# Patient Record
Sex: Male | Born: 1945 | Race: White | Hispanic: No | Marital: Married | Smoking: Never smoker
Health system: Southern US, Community
[De-identification: ages and names within clinical notes are randomized; demographics above are authoritative.]

## PROBLEM LIST (undated history)

## (undated) DIAGNOSIS — E119 Type 2 diabetes mellitus without complications: Secondary | ICD-10-CM

## (undated) DIAGNOSIS — K5790 Diverticulosis of intestine, part unspecified, without perforation or abscess without bleeding: Secondary | ICD-10-CM

## (undated) DIAGNOSIS — K219 Gastro-esophageal reflux disease without esophagitis: Secondary | ICD-10-CM

## (undated) DIAGNOSIS — I10 Essential (primary) hypertension: Secondary | ICD-10-CM

## (undated) DIAGNOSIS — E079 Disorder of thyroid, unspecified: Secondary | ICD-10-CM

## (undated) DIAGNOSIS — I2510 Atherosclerotic heart disease of native coronary artery without angina pectoris: Secondary | ICD-10-CM

## (undated) DIAGNOSIS — E039 Hypothyroidism, unspecified: Secondary | ICD-10-CM

## (undated) DIAGNOSIS — R7303 Prediabetes: Secondary | ICD-10-CM

## (undated) DIAGNOSIS — K589 Irritable bowel syndrome without diarrhea: Secondary | ICD-10-CM

## (undated) HISTORY — PX: CIRCUMCISION: SUR203

## (undated) HISTORY — PX: OTHER SURGICAL HISTORY: SHX169

## (undated) HISTORY — DX: Type 2 diabetes mellitus without complications: E11.9

## (undated) HISTORY — PX: COLONOSCOPY: SHX174

## (undated) HISTORY — PX: TONSILLECTOMY: SUR1361

## (undated) HISTORY — DX: Atherosclerotic heart disease of native coronary artery without angina pectoris: I25.10

---
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
## 2016-06-25 HISTORY — PX: COLONOSCOPY: SHX174

## 2017-05-28 HISTORY — PX: MENISCUS REPAIR: SHX5179

## 2019-08-17 ENCOUNTER — Other Ambulatory Visit: Payer: Self-pay

## 2019-08-17 ENCOUNTER — Other Ambulatory Visit (HOSPITAL_COMMUNITY): Payer: Self-pay | Admitting: Physician Assistant

## 2019-08-17 ENCOUNTER — Ambulatory Visit (HOSPITAL_COMMUNITY)
Admission: RE | Admit: 2019-08-17 | Discharge: 2019-08-17 | Disposition: A | Discharge status: Home / Self Care | Payer: Medicare Other | Source: Ambulatory Visit | Attending: Physician Assistant | Admitting: Physician Assistant

## 2019-08-17 DIAGNOSIS — M25562 Pain in left knee: Secondary | ICD-10-CM

## 2019-09-15 DIAGNOSIS — M25562 Pain in left knee: Secondary | ICD-10-CM | POA: Insufficient documentation

## 2020-01-22 ENCOUNTER — Encounter (HOSPITAL_COMMUNITY): Payer: Self-pay

## 2020-01-22 ENCOUNTER — Emergency Department (HOSPITAL_COMMUNITY)
Admission: EM | Admit: 2020-01-22 | Discharge: 2020-01-22 | Disposition: A | Discharge status: Home / Self Care | Payer: Medicare Other | Attending: Emergency Medicine | Admitting: Emergency Medicine

## 2020-01-22 ENCOUNTER — Other Ambulatory Visit: Payer: Self-pay

## 2020-01-22 ENCOUNTER — Emergency Department (HOSPITAL_COMMUNITY): Payer: Medicare Other

## 2020-01-22 DIAGNOSIS — M545 Low back pain, unspecified: Secondary | ICD-10-CM | POA: Diagnosis not present

## 2020-01-22 DIAGNOSIS — R109 Unspecified abdominal pain: Secondary | ICD-10-CM | POA: Insufficient documentation

## 2020-01-22 DIAGNOSIS — E119 Type 2 diabetes mellitus without complications: Secondary | ICD-10-CM | POA: Insufficient documentation

## 2020-01-22 HISTORY — DX: Gastro-esophageal reflux disease without esophagitis: K21.9

## 2020-01-22 HISTORY — DX: Disorder of thyroid, unspecified: E07.9

## 2020-01-22 HISTORY — DX: Irritable bowel syndrome, unspecified: K58.9

## 2020-01-22 HISTORY — DX: Type 2 diabetes mellitus without complications: E11.9

## 2020-01-22 LAB — BASIC METABOLIC PANEL
Anion gap: 9 (ref 5–15)
BUN: 18 mg/dL (ref 8–23)
CO2: 23 mmol/L (ref 22–32)
Calcium: 9.3 mg/dL (ref 8.9–10.3)
Chloride: 104 mmol/L (ref 98–111)
Creatinine, Ser: 1.07 mg/dL (ref 0.61–1.24)
GFR, Estimated: >60 mL/min (ref >60)
Glucose, Bld: 178 mg/dL — ABNORMAL HIGH (ref 70–99)
Potassium: 4.1 mmol/L (ref 3.5–5.1)
Sodium: 136 mmol/L (ref 135–145)

## 2020-01-22 LAB — CBC WITH DIFFERENTIAL/PLATELET
Abs Immature Granulocytes: 0.07 10*3/uL (ref 0.00–0.07)
Basophils Absolute: 0.1 10*3/uL (ref 0.0–0.1)
Basophils Relative: 1 %
Eosinophils Absolute: 0.1 10*3/uL (ref 0.0–0.5)
Eosinophils Relative: 2 %
HCT: 40.7 % (ref 39.0–52.0)
Hemoglobin: 14.1 g/dL (ref 13.0–17.0)
Immature Granulocytes: 1 %
Lymphocytes Relative: 22 %
Lymphs Abs: 1.3 10*3/uL (ref 0.7–4.0)
MCH: 30.9 pg (ref 26.0–34.0)
MCHC: 34.6 g/dL (ref 30.0–36.0)
MCV: 89.3 fL (ref 80.0–100.0)
Monocytes Absolute: 0.4 10*3/uL (ref 0.1–1.0)
Monocytes Relative: 7 %
Neutro Abs: 4.1 10*3/uL (ref 1.7–7.7)
Neutrophils Relative %: 67 %
Platelets: 213 10*3/uL (ref 150–400)
RBC: 4.56 MIL/uL (ref 4.22–5.81)
RDW: 13.1 % (ref 11.5–15.5)
WBC: 6.1 10*3/uL (ref 4.0–10.5)
nRBC: 0 % (ref 0.0–0.2)

## 2020-01-22 LAB — URINALYSIS, ROUTINE W REFLEX MICROSCOPIC
Bilirubin Urine: NEGATIVE
Glucose, UA: 150 mg/dL — AB
Hgb urine dipstick: NEGATIVE
Ketones, ur: NEGATIVE mg/dL
Leukocytes,Ua: NEGATIVE
Nitrite: NEGATIVE
Protein, ur: NEGATIVE mg/dL
Specific Gravity, Urine: 1.012 (ref 1.005–1.030)
pH: 6 (ref 5.0–8.0)

## 2020-01-22 MED ORDER — METHOCARBAMOL 500 MG PO TABS: 500.0000 mg | ORAL_TABLET | Freq: Three times a day (TID) | ORAL | 0 refills | Status: DC

## 2020-01-22 MED ORDER — MORPHINE SULFATE (PF) 4 MG/ML IV SOLN
4.0000 mg | Freq: Once | INTRAVENOUS | Status: AC
  Administered 2020-01-22: 4 mg via INTRAVENOUS
  Filled 2020-01-22: qty 1

## 2020-01-22 MED ORDER — KETOROLAC TROMETHAMINE 30 MG/ML IJ SOLN
30.0000 mg | Freq: Once | INTRAMUSCULAR | Status: AC
  Administered 2020-01-22: 30 mg via INTRAVENOUS
  Filled 2020-01-22: qty 1

## 2020-01-22 MED ORDER — ONDANSETRON HCL 4 MG/2ML IJ SOLN
4.0000 mg | Freq: Once | INTRAMUSCULAR | Status: AC
  Administered 2020-01-22: 4 mg via INTRAVENOUS
  Filled 2020-01-22: qty 2

## 2020-01-22 MED ORDER — NAPROXEN 500 MG PO TABS: 500.0000 mg | ORAL_TABLET | Freq: Two times a day (BID) | ORAL | 0 refills | Status: DC

## 2020-01-22 NOTE — ED Notes (Signed)
Pt to CT

## 2020-01-22 NOTE — Discharge Instructions (Addendum)
Your blood work today was reassuring.  The CT scan did not show evidence of a kidney stone.  Your symptoms are likely musculoskeletal.  Try to avoid twisting, bending or heavy lifting for at least 1 week.  You may also alternate ice and heat to your left lower back.  Follow-up with your primary doctor for recheck or return emergency department if you develop any worsening symptoms.

## 2020-01-22 NOTE — ED Notes (Signed)
Patient transported to CT 

## 2020-01-22 NOTE — ED Triage Notes (Signed)
Pt presents to ED with complaints of left flank pain started 1 weeks ago but worse today. Pt denies urinary symptoms.

## 2020-01-22 NOTE — ED Provider Notes (Signed)
[facility] EMERGENCY DEPARTMENT Provider Note   CSN: [id number] Arrival date & time: 01/22/20  1631     History Chief Complaint  Patient presents with  . Flank Pain    Bradley Acevedo Bradley Acevedo is a 74 y.o. male.  HPI      Bradley Acevedo is a 74 y.o. male with history of DM, thyroid disease and GERD who presents to the Emergency Department complaining of left sided flank pain for one week.  Gradually worsening today.  He describes a constant, pain to his left lower back that is non radiating but worse with movement.  He has been taking OTC pain medications without relief.  He states that he was dumping leaves from his mower bagger on Saturday.  He denies fall, abdominal pain, dysuria, urine or bowel changes, fever, pain or weakness of the lower extremities.  No history of kidney stones.     Past Medical History:  Diagnosis Date  . Acid reflux   . Diabetes mellitus without complication (HCC)   . IBS (irritable bowel syndrome)   . Thyroid disease     There are no problems to display for this patient.   Past Surgical History:  Procedure Laterality Date  . CIRCUMCISION    . COLONOSCOPY    . meniscus left knee    . TONSILLECTOMY         No family history on file.  Social History   Tobacco Use  . Smoking status: Never Smoker  . Smokeless tobacco: Never Used  Substance Use Topics  . Alcohol use: Not Currently  . Drug use: Not Currently    Home Medications Prior to Admission medications   Medication Sig Start Date End Date Taking? Authorizing Provider  methocarbamol (ROBAXIN) 500 MG tablet Take 1 tablet (500 mg total) by mouth 3 (three) times daily. 01/22/20   Serin Thornell, PA-C  naproxen (NAPROSYN) 500 MG tablet Take 1 tablet (500 mg total) by mouth 2 (two) times daily with a meal. 01/22/20   Tiahna Cure, PA-C    Allergies    Patient has no known allergies.  Review of Systems   Review of Systems  Constitutional: Negative for fever.  Respiratory: Negative  for shortness of breath.   Cardiovascular: Negative for chest pain.  Gastrointestinal: Negative for abdominal pain, constipation, diarrhea, nausea and vomiting.  Genitourinary: Positive for flank pain. Negative for decreased urine volume, difficulty urinating, discharge, dysuria, frequency, hematuria, penile swelling, scrotal swelling and testicular pain.  Musculoskeletal: Positive for back pain. Negative for joint swelling.  Skin: Negative for rash.  Neurological: Negative for weakness and numbness.    Physical Exam Updated Vital Signs BP (!) 152/89 (BP Location: Left Arm)   Pulse 76   Temp 97.9 F (36.6 C) (Oral)   Resp 15   Ht 5\' 10"  (1.778 m)   Wt 113.4 kg   SpO2 100%   BMI 35.87 kg/m   Physical Exam Vitals and nursing note reviewed.  Constitutional:      General: He is not in acute distress.    Appearance: Normal appearance. He is not toxic-appearing.  HENT:     Mouth/Throat:     Mouth: Mucous membranes are moist.  Cardiovascular:     Rate and Rhythm: Normal rate and regular rhythm.     Pulses: Normal pulses.  Pulmonary:     Effort: Pulmonary effort is normal.     Breath sounds: Normal breath sounds.  Chest:     Chest wall: No tenderness.  Abdominal:  General: There is no distension.     Palpations: Abdomen is soft.     Tenderness: There is no abdominal tenderness. There is no right CVA tenderness or left CVA tenderness.  Musculoskeletal:        General: Tenderness present.     Cervical back: Normal range of motion.     Right lower leg: No edema.     Left lower leg: No edema.     Comments: Focal ttp of the left lower lumbar paraspinal muscles.  No CVA tenderness.  Neg SLR bilaterally  Skin:    General: Skin is warm.     Capillary Refill: Capillary refill takes less than 2 seconds.     Findings: No rash.  Neurological:     General: No focal deficit present.     Mental Status: He is alert.     ED Results / Procedures / Treatments   Labs (all labs  ordered are listed, but only abnormal results are displayed) Labs Reviewed  BASIC METABOLIC PANEL - Abnormal; Notable for the following components:      Result Value   Glucose, Bld 178 (*)    All other components within normal limits  URINALYSIS, ROUTINE W REFLEX MICROSCOPIC - Abnormal; Notable for the following components:   Color, Urine STRAW (*)    Glucose, UA 150 (*)    All other components within normal limits  CBC WITH DIFFERENTIAL/PLATELET    EKG None  Radiology CT Renal Stone Study  Result Date: 01/22/2020 CLINICAL DATA:  Flank pain.  Evaluate for kidney stone. EXAM: CT ABDOMEN AND PELVIS WITHOUT CONTRAST TECHNIQUE: Multidetector CT imaging of the abdomen and pelvis was performed following the standard protocol without IV contrast. COMPARISON:  None. FINDINGS: Lower chest: No acute abnormality. Hepatobiliary: There is no focal liver abnormality. Gallbladder unremarkable. No biliary ductal dilatation. Pancreas: No pancreatic inflammation or main duct dilatation. A few calcifications are noted within the head and neck of pancreas. No pancreatic mass noted. Spleen: The spleen measures 11.7 by 4.6 by 14.4 cm (volume = 410 cm^3). No focal splenic abnormality identified. Adrenals/Urinary Tract: Normal appearance of the adrenal glands. No renal calculi identified bilaterally. Small cyst measuring 1.2 cm arises from the medial cortex of the inferior pole of left kidney. Bilateral pelvocaliectasis versus parapelvic cysts noted no hydroureter or ureteral calculi. Stomach/Bowel: Stomach appears normal. The appendix is visualized and is unremarkable. Proximal small bowel loops are upper limits of normal and caliber measuring up to 3.1 cm. Here, there is mild fecalization of the enteric contents. The mid and distal small bowel loops are unremarkable. Multiple colonic diverticula noted without signs of acute inflammation. Vascular/Lymphatic: Aortic atherosclerosis. No aneurysm. No abdominopelvic  adenopathy identified. Reproductive: Mild prostate gland enlargement. Other: No free fluid or fluid collections identified. Musculoskeletal: Bilateral facet arthropathy noted within the lower lumbar spine. No acute or suspicious osseous findings. IMPRESSION: 1. No urinary tract calculi identified.Bilateral pelvocaliectasis versus parapelvic cysts. 2. Mild increase caliber of proximal small bowel loops with fecalization of the enteric contents. Findings are nonspecific but may reflect underlying inflammatory or infectious enteritis. No signs of high-grade bowel obstruction 3. Splenomegaly.  Etiology indeterminate. 4. Aortic atherosclerosis. Aortic Atherosclerosis (ICD10-I70.0). Electronically Signed   By: Signa Kell M.D.   On: 01/22/2020 18:29    Procedures Procedures (including critical care time)  Medications Ordered in ED Medications  morphine 4 MG/ML injection 4 mg (4 mg Intravenous Given 01/22/20 1759)  ondansetron (ZOFRAN) injection 4 mg (4 mg Intravenous Given 01/22/20 1758)  ketorolac (TORADOL) 30 MG/ML injection 30 mg (30 mg Intravenous Given 01/22/20 1903)    ED Course  I have reviewed the triage vital signs and the nursing notes.  Pertinent labs & imaging results that were available during my care of the patient were reviewed by me and considered in my medical decision making (see chart for details).    MDM Rules/Calculators/A&P                          Pt with left flank pain for one week, worse today.  No dysuria.  No hx of kidney stone.  Pain is located in the lower lumbar paraspinal area and felt to be musculoskeletal in origin.  Low clinical suspicion for kidney stone.  Will obtain urine and CT renal stone study.  On recheck, patient reports feeling much better after Toradol injection given here.  CT scan without evidence of ureteral calculi or hydronephrosis.  Urinalysis reassuring.  I feel the patient's symptoms are musculoskeletal.  He agrees to symptomatic treatment and  close outpatient follow-up.  Return precautions discussed.  Feel that he is appropriate for discharge at this time.   Final Clinical Impression(s) / ED Diagnoses Final diagnoses:  Left flank pain    Rx / DC Orders ED Discharge Orders         Ordered    naproxen (NAPROSYN) 500 MG tablet  2 times daily with meals        01/22/20 2025    methocarbamol (ROBAXIN) 500 MG tablet  3 times daily        01/22/20 2025           Pauline Aus, PA-C 01/24/20 1628    Gerhard Munch, MD 01/26/20 1530

## 2020-06-28 ENCOUNTER — Observation Stay (HOSPITAL_BASED_OUTPATIENT_CLINIC_OR_DEPARTMENT_OTHER): Payer: Medicare Other

## 2020-06-28 ENCOUNTER — Other Ambulatory Visit: Payer: Self-pay

## 2020-06-28 ENCOUNTER — Observation Stay (HOSPITAL_COMMUNITY)
Admission: EM | Admit: 2020-06-28 | Discharge: 2020-06-29 | Disposition: A | Discharge status: Home / Self Care | Payer: Medicare Other | Attending: Internal Medicine | Admitting: Internal Medicine

## 2020-06-28 ENCOUNTER — Encounter (HOSPITAL_COMMUNITY): Payer: Self-pay | Admitting: *Deleted

## 2020-06-28 ENCOUNTER — Emergency Department (HOSPITAL_COMMUNITY): Payer: Medicare Other

## 2020-06-28 DIAGNOSIS — R079 Chest pain, unspecified: Secondary | ICD-10-CM | POA: Diagnosis present

## 2020-06-28 DIAGNOSIS — Z20822 Contact with and (suspected) exposure to covid-19: Secondary | ICD-10-CM | POA: Insufficient documentation

## 2020-06-28 DIAGNOSIS — Z79899 Other long term (current) drug therapy: Secondary | ICD-10-CM | POA: Insufficient documentation

## 2020-06-28 DIAGNOSIS — E119 Type 2 diabetes mellitus without complications: Secondary | ICD-10-CM | POA: Insufficient documentation

## 2020-06-28 DIAGNOSIS — I10 Essential (primary) hypertension: Secondary | ICD-10-CM

## 2020-06-28 DIAGNOSIS — R072 Precordial pain: Secondary | ICD-10-CM

## 2020-06-28 DIAGNOSIS — Z7984 Long term (current) use of oral hypoglycemic drugs: Secondary | ICD-10-CM | POA: Diagnosis not present

## 2020-06-28 DIAGNOSIS — R0789 Other chest pain: Principal | ICD-10-CM | POA: Insufficient documentation

## 2020-06-28 DIAGNOSIS — E039 Hypothyroidism, unspecified: Secondary | ICD-10-CM | POA: Diagnosis not present

## 2020-06-28 HISTORY — DX: Prediabetes: R73.03

## 2020-06-28 HISTORY — DX: Diverticulosis of intestine, part unspecified, without perforation or abscess without bleeding: K57.90

## 2020-06-28 HISTORY — DX: Hypothyroidism, unspecified: E03.9

## 2020-06-28 HISTORY — DX: Essential (primary) hypertension: I10

## 2020-06-28 LAB — ECHOCARDIOGRAM COMPLETE
Area-P 1/2: 2.79 cm2
Height: 70 in
S' Lateral: 2.92 cm
Weight: 4000 oz

## 2020-06-28 LAB — BASIC METABOLIC PANEL
Anion gap: 7 (ref 5–15)
BUN: 23 mg/dL (ref 8–23)
CO2: 24 mmol/L (ref 22–32)
Calcium: 9 mg/dL (ref 8.9–10.3)
Chloride: 106 mmol/L (ref 98–111)
Creatinine, Ser: 0.91 mg/dL (ref 0.61–1.24)
GFR, Estimated: >60 mL/min (ref >60)
Glucose, Bld: 146 mg/dL — ABNORMAL HIGH (ref 70–99)
Potassium: 4.2 mmol/L (ref 3.5–5.1)
Sodium: 137 mmol/L (ref 135–145)

## 2020-06-28 LAB — HEMOGLOBIN A1C
Hgb A1c MFr Bld: 6.8 % — ABNORMAL HIGH (ref 4.8–5.6)
Mean Plasma Glucose: 148.46 mg/dL

## 2020-06-28 LAB — CBC
HCT: 39.5 % (ref 39.0–52.0)
Hemoglobin: 13.4 g/dL (ref 13.0–17.0)
MCH: 30.9 pg (ref 26.0–34.0)
MCHC: 33.9 g/dL (ref 30.0–36.0)
MCV: 91 fL (ref 80.0–100.0)
Platelets: 192 10*3/uL (ref 150–400)
RBC: 4.34 MIL/uL (ref 4.22–5.81)
RDW: 13.4 % (ref 11.5–15.5)
WBC: 5.6 10*3/uL (ref 4.0–10.5)
nRBC: 0 % (ref 0.0–0.2)

## 2020-06-28 LAB — LIPID PANEL
Cholesterol: 98 mg/dL (ref 0–200)
HDL: 29 mg/dL — ABNORMAL LOW (ref >40)
LDL Cholesterol: 49 mg/dL (ref 0–99)
Total CHOL/HDL Ratio: 3.4 RATIO
Triglycerides: 101 mg/dL (ref <150)
VLDL: 20 mg/dL (ref 0–40)

## 2020-06-28 LAB — RESP PANEL BY RT-PCR (FLU A&B, COVID) ARPGX2
Influenza A by PCR: NEGATIVE
Influenza B by PCR: NEGATIVE
SARS Coronavirus 2 by RT PCR: NEGATIVE

## 2020-06-28 LAB — TROPONIN I (HIGH SENSITIVITY)
Troponin I (High Sensitivity): 6 ng/L (ref <18)
Troponin I (High Sensitivity): 7 ng/L (ref <18)
Troponin I (High Sensitivity): 6 ng/L (ref <18)
Troponin I (High Sensitivity): 6 ng/L (ref <18)

## 2020-06-28 MED ORDER — ACETAMINOPHEN 325 MG PO TABS: 650.0000 mg | ORAL_TABLET | Freq: Four times a day (QID) | ORAL | Status: DC | PRN

## 2020-06-28 MED ORDER — ONDANSETRON HCL 4 MG PO TABS: 4.0000 mg | ORAL_TABLET | Freq: Four times a day (QID) | ORAL | Status: DC | PRN

## 2020-06-28 MED ORDER — ASPIRIN EC 81 MG PO TBEC
81.0000 mg | DELAYED_RELEASE_TABLET | Freq: Every day | ORAL | Status: DC
  Administered 2020-06-29: 81 mg via ORAL
  Filled 2020-06-28: qty 1

## 2020-06-28 MED ORDER — PANTOPRAZOLE SODIUM 40 MG PO TBEC
40.0000 mg | DELAYED_RELEASE_TABLET | Freq: Every day | ORAL | Status: DC
  Administered 2020-06-29: 40 mg via ORAL
  Filled 2020-06-28: qty 1

## 2020-06-28 MED ORDER — NITROGLYCERIN 0.4 MG SL SUBL
0.4000 mg | SUBLINGUAL_TABLET | SUBLINGUAL | Status: DC | PRN
  Administered 2020-06-28: 0.4 mg via SUBLINGUAL
  Filled 2020-06-28: qty 1

## 2020-06-28 MED ORDER — SODIUM CHLORIDE 0.9 % IV SOLN: 250.0000 mL | INTRAVENOUS | Status: DC | PRN

## 2020-06-28 MED ORDER — LEVOTHYROXINE SODIUM 50 MCG PO TABS
50.0000 ug | ORAL_TABLET | Freq: Every day | ORAL | Status: DC
  Administered 2020-06-29: 50 ug via ORAL
  Filled 2020-06-28: qty 1

## 2020-06-28 MED ORDER — ACETAMINOPHEN 650 MG RE SUPP: 650.0000 mg | Freq: Four times a day (QID) | RECTAL | Status: DC | PRN

## 2020-06-28 MED ORDER — SODIUM CHLORIDE 0.9% FLUSH
3.0000 mL | Freq: Two times a day (BID) | INTRAVENOUS | Status: DC
  Administered 2020-06-28 – 2020-06-29 (×2): 3 mL via INTRAVENOUS

## 2020-06-28 MED ORDER — SODIUM CHLORIDE 0.9% FLUSH
3.0000 mL | INTRAVENOUS | Status: DC | PRN
  Administered 2020-06-28: 3 mL via INTRAVENOUS

## 2020-06-28 MED ORDER — LISINOPRIL 10 MG PO TABS
10.0000 mg | ORAL_TABLET | Freq: Every day | ORAL | Status: DC
  Administered 2020-06-29: 10 mg via ORAL
  Filled 2020-06-28: qty 1

## 2020-06-28 MED ORDER — ENOXAPARIN SODIUM 60 MG/0.6ML ~~LOC~~ SOLN
60.0000 mg | SUBCUTANEOUS | Status: DC
  Administered 2020-06-28: 60 mg via SUBCUTANEOUS
  Filled 2020-06-28: qty 0.6

## 2020-06-28 MED ORDER — HYOSCYAMINE SULFATE 0.125 MG PO TBDP: 0.1250 mg | ORAL_TABLET | ORAL | Status: DC | PRN

## 2020-06-28 MED ORDER — ONDANSETRON HCL 4 MG/2ML IJ SOLN: 4.0000 mg | Freq: Four times a day (QID) | INTRAMUSCULAR | Status: DC | PRN

## 2020-06-28 NOTE — H&P (Signed)
History and Physical    Bradley Acevedo RXV:[id number] DOB: March 30, 1945 DOA: 06/28/2020  PCP: Shawnie Dapper, PA-C   Patient coming from: Home  Chief Complaint: Chest pain  HPI: Bradley Acevedo is a 75 y.o. male with medical history significant for prior tobacco use, obesity, hypertension, hypothyroidism, and GERD who presented to the ED sudden onset of substernal chest pressure along with sweats and weakness that developed as he was working in his garden and moving heavy rocks.  He took 2 baby aspirin at home to check his blood pressure which was noted to be elevated and this is quite unusual for him.  He stated that the pain was like a pressure sensation, but was not radiating to his arm, shoulder, or his neck.  He denies any palpitations.  No fevers, chills, recent cough, or abdominal pain noted.  Patient did have some food poisoning on the 17th and did have some severe nausea, vomiting, diarrhea for approximately 2 days.  He is now able to eat however, but was concerned that he may have been somewhat dehydrated.   ED Course: Stable vital signs noted her troponin is 6, EKG with no acute findings.  Chest pain improved after nitroglycerin given in the ED and blood pressure was also improved.  Chest x-ray negative for any acute findings.  Laboratory data otherwise within normal limits.  Review of Systems: Reviewed as noted above, otherwise negative.  Past Medical History:  Diagnosis Date  . Acid reflux   . Borderline diabetes   . Diverticulosis   . Hypertension   . Hypothyroid   . IBS (irritable bowel syndrome)   . Thyroid disease     Past Surgical History:  Procedure Laterality Date  . CIRCUMCISION    . COLONOSCOPY    . COLONOSCOPY  06/25/2016   [place], [place] by [name]  . meniscus left knee    . MENISCUS REPAIR Left 05/28/2017   [place], [place] by Dr. [name]  . TONSILLECTOMY  1953   [place], [place]     reports that he has never smoked. He has never used smokeless tobacco.  He reports previous alcohol use. He reports previous drug use.  No Known Allergies  No family history on file.  Prior to Admission medications   Medication Sig Start Date End Date Taking? Authorizing Provider  Cholecalciferol (D3 DOTS) 50 MCG (2000 UT) TBDP Take 1 tablet by mouth daily.   Yes [provider]  esomeprazole (NEXIUM) 20 MG capsule Take 20 mg by mouth daily at 12 noon.   Yes [provider]  hyoscyamine (ANASPAZ) 0.125 MG TBDP disintergrating tablet Take 0.125-0.25 mg by mouth every 4 (four) hours as needed for bladder spasms or cramping. 01/29/20  Yes [provider]  levothyroxine (SYNTHROID) 50 MCG tablet Take 50 mcg by mouth daily. 03/22/20  Yes [provider]  lisinopril (ZESTRIL) 10 MG tablet Take 1 tablet by mouth daily. 05/22/20  Yes [provider]  metFORMIN (GLUCOPHAGE) 500 MG tablet Take 1 tablet by mouth daily. 06/19/20  Yes [provider]  ondansetron (ZOFRAN-ODT) 4 MG disintegrating tablet Take 4 mg by mouth every 8 (eight) hours as needed for nausea or vomiting. 01/29/20  Yes [provider]  Probiotic Product (PROBIOTIC PO) Take 1 tablet by mouth daily.   Yes [provider]  methocarbamol (ROBAXIN) 500 MG tablet Take 1 tablet (500 mg total) by mouth 3 (three) times daily. Patient not taking: No sig reported 01/22/20   Pauline Aus, PA-C  naproxen (NAPROSYN) 500 MG tablet Take 1 tablet (500 mg total) by mouth 2 (two) times daily with a meal. Patient not taking: No sig reported 01/22/20   Pauline Aus, PA-C    Physical Exam: Vitals:   06/28/20 0857 06/28/20 0902 06/28/20 0930 06/28/20 1000  BP:  (!) 177/85 136/75 126/69  Pulse:  85 79 65  Resp:  14 20 14   Temp:  (!) 97.5 F (36.4 C)    TempSrc:  Oral    SpO2:  100% 100% 100%  Weight: 113.4 kg     Height: 5\' 10"  (1.778 m)       Constitutional: NAD, calm, comfortable, obese Vitals:   06/28/20 0857 06/28/20 0902 06/28/20 0930  06/28/20 1000  BP:  (!) 177/85 136/75 126/69  Pulse:  85 79 65  Resp:  14 20 14   Temp:  (!) 97.5 F (36.4 C)    TempSrc:  Oral    SpO2:  100% 100% 100%  Weight: 113.4 kg     Height: 5\' 10"  (1.778 m)      Eyes: lids and conjunctivae normal Neck: normal, supple Respiratory: clear to auscultation bilaterally. Normal respiratory effort. No accessory muscle use.  Cardiovascular: Regular rate and rhythm, no murmurs. Abdomen: no tenderness, no distention. Bowel sounds positive.  Musculoskeletal:  No edema. Skin: no rashes, lesions, ulcers.  Psychiatric: Flat affect  Labs on Admission: I have personally reviewed following labs and imaging studies  CBC: Recent Labs  Lab 06/28/20 0904  WBC 5.6  HGB 13.4  HCT 39.5  MCV 91.0  PLT 192   Basic Metabolic Panel: Recent Labs  Lab 06/28/20 0904  NA 137  K 4.2  CL 106  CO2 24  GLUCOSE 146*  BUN 23  CREATININE 0.91  CALCIUM 9.0   GFR: Estimated Creatinine Clearance: 91.2 mL/min (by C-G formula based on SCr of 0.91 mg/dL). Liver Function Tests: No results for input(s): AST, ALT, ALKPHOS, BILITOT, PROT, ALBUMIN in the last 168 hours. No results for input(s): LIPASE, AMYLASE in the last 168 hours. No results for input(s): AMMONIA in the last 168 hours. Coagulation Profile: No results for input(s): INR, PROTIME in the last 168 hours. Cardiac Enzymes: No results for input(s): CKTOTAL, CKMB, CKMBINDEX, TROPONINI in the last 168 hours. BNP (last 3 results) No results for input(s): PROBNP in the last 8760 hours. HbA1C: No results for input(s): HGBA1C in the last 72 hours. CBG: No results for input(s): GLUCAP in the last 168 hours. Lipid Profile: No results for input(s): CHOL, HDL, LDLCALC, TRIG, CHOLHDL, LDLDIRECT in the last 72 hours. Thyroid Function Tests: No results for input(s): TSH, T4TOTAL, FREET4, T3FREE, THYROIDAB in the last 72 hours. Anemia Panel: No results for input(s): VITAMINB12, FOLATE, FERRITIN, TIBC, IRON,  RETICCTPCT in the last 72 hours. Urine analysis:    Component Value Date/Time   COLORURINE STRAW (A) 01/22/2020 1720   APPEARANCEUR CLEAR 01/22/2020 1720   LABSPEC 1.012 01/22/2020 1720   PHURINE 6.0 01/22/2020 1720   GLUCOSEU 150 (A) 01/22/2020 1720   HGBUR NEGATIVE 01/22/2020 1720   BILIRUBINUR NEGATIVE 01/22/2020 1720   KETONESUR NEGATIVE 01/22/2020 1720   PROTEINUR NEGATIVE 01/22/2020 1720   NITRITE NEGATIVE 01/22/2020 1720   LEUKOCYTESUR NEGATIVE 01/22/2020 1720    Radiological Exams on Admission: DG Chest Port 1 View  Result Date: 06/28/2020 CLINICAL DATA:  Chest pain EXAM: PORTABLE CHEST 1 VIEW COMPARISON:  None. FINDINGS: The heart size and mediastinal contours are within normal limits. Both lungs are clear. No pleural effusion  or pneumothorax. The visualized skeletal structures are unremarkable. IMPRESSION: No active disease. Electronically Signed   By: Guadlupe Spanish M.D.   On: 06/28/2020 10:06    EKG: Independently reviewed. SR 92bpm.  Assessment/Plan Active Problems:   Chest pain    Chest pain suggestive of angina versus GERD -Prior cardiac catheterization in 2007 negative for any significant findings -Patient has some exertional pain but was improved with nitroglycerin in the ED -Troponin and EKG thus far reassuring -2D echocardiogram ordered -Appreciate cardiology evaluation with HEART score of 6  Type 2 diabetes -Hold metformin for now -Carb modified diet -SSI  Hypertension -Continue lisinopril  Hypothyroidism -Continue levothyroxine  Obesity -Lifestyle changes outpatient  GERD -Continue on PPI -Recently noted to have food poisoning   DVT prophylaxis: Lovenox Code Status: Full Family Communication: Pt will call daughter Disposition Plan:Chest pain-obs Consults called:Cardiology Admission status: Obs, Tele   Lamya Lausch D Arieanna Pressey DO [facility]  If 7PM-7AM, please contact night-coverage [email or web address]  06/28/2020, 10:51 AM

## 2020-06-28 NOTE — Consult Note (Addendum)
Cardiology Consultation:   Patient ID: Bradley Acevedo MRN: [id number]; DOB: [birth year]  Admit date: 06/28/2020 Date of Consult: 06/28/2020  PCP:  Samuella Bruin   [facility]  Cardiologist:  New to [facility] - Dr. [name]   Patient Profile:   Bradley Acevedo is a 75 y.o. male with past medical history of HTN, Type 2 DM and Hypothyroidism who is being seen today for the evaluation of chest pain at the request of Dr. Particia Nearing.  History of Present Illness:   Mr. Upchurch reports being in his usual state of health until Sunday night when he developed what he thinks is food poisoning as he was eating leftovers and does not think he heated them thoroughly. Had nausea and vomiting throughout the night.  Says that he felt more fatigued and was more short of breath the following morning. Reports intermittent dyspnea on exertion since. Today, he was working in his garden around 0500 when he developed chest pressure while moving concrete blocks. He went to the house to check his blood pressure and reports his SBP was in the 180's which is unusual for him. He took it easy throughout the morning but his pain persisted and upon telling his daughter, he initially went to his PCP's office and then was informed to proceed to the ED. His pain did resolve while in the ED with nitroglycerin. He denies any recurrent pain since. His episode lasted for over 4 hours. He denies any recent orthopnea, PND or lower extremity edema. No known family history of CAD.  He is a former smoker and was previously smoking 3 packs/day but reports he quit in 2007.  He did have a stress test and cardiac catheterization in 2007 while in [place] and reports his catheterization was "normal".  Initial labs show WBC 5.6, Hgb 13.4, platelets 192, Na+ 137, K+ 4.2 and creatinine 0.91. Initial Hs Troponin negative with repeat pending. COVID testing pending. CXR with no active disease. EKG shows NSR, HR 92 with no acute  ST abnormalities.   Past Medical History:  Diagnosis Date  . Acid reflux   . Borderline diabetes   . Diverticulosis   . Hypertension   . Hypothyroid   . IBS (irritable bowel syndrome)   . Thyroid disease     Past Surgical History:  Procedure Laterality Date  . CIRCUMCISION    . COLONOSCOPY    . COLONOSCOPY  06/25/2016   [place], [place] by [name]  . meniscus left knee    . MENISCUS REPAIR Left 05/28/2017   [place], [place] by Dr. [name]  . TONSILLECTOMY  1953   [place], [place]     Home Medications:  Prior to Admission medications   Medication Sig Start Date End Date Taking? Authorizing Provider  Cholecalciferol (D3 DOTS) 50 MCG (2000 UT) TBDP Take 1 tablet by mouth daily.   Yes [provider]  esomeprazole (NEXIUM) 20 MG capsule Take 20 mg by mouth daily at 12 noon.   Yes [provider]  hyoscyamine (ANASPAZ) 0.125 MG TBDP disintergrating tablet Take 0.125-0.25 mg by mouth every 4 (four) hours as needed for bladder spasms or cramping. 01/29/20  Yes [provider]  levothyroxine (SYNTHROID) 50 MCG tablet Take 50 mcg by mouth daily. 03/22/20  Yes [provider]  lisinopril (ZESTRIL) 10 MG tablet Take 1 tablet by mouth daily. 05/22/20  Yes [provider]  metFORMIN (GLUCOPHAGE) 500 MG tablet Take 1 tablet by mouth daily. 06/19/20  Yes [provider]  ondansetron (ZOFRAN-ODT) 4 MG disintegrating tablet Take 4 mg by mouth every 8 (eight) hours as needed for nausea or vomiting. 01/29/20  Yes [provider]  Probiotic Product (PROBIOTIC PO) Take 1 tablet by mouth daily.   Yes [provider]  methocarbamol (ROBAXIN) 500 MG tablet Take 1 tablet (500 mg total) by mouth 3 (three) times daily. Patient not taking: No sig reported 01/22/20   Triplett, Tammy, PA-C  naproxen (NAPROSYN) 500 MG tablet Take 1 tablet (500 mg total) by mouth 2 (two) times daily with a meal. Patient not taking: No sig reported 01/22/20    Pauline Aus, PA-C    Inpatient Medications: Scheduled Meds: . [START ON 06/29/2020] aspirin EC  81 mg Oral Daily  . enoxaparin (LOVENOX) injection  60 mg Subcutaneous Q24H  . [START ON 06/29/2020] levothyroxine  50 mcg Oral Daily  . lisinopril  10 mg Oral Daily  . pantoprazole  40 mg Oral Daily  . sodium chloride flush  3 mL Intravenous Q12H   Continuous Infusions: . sodium chloride     PRN Meds: sodium chloride, acetaminophen **OR** acetaminophen, hyoscyamine, nitroGLYCERIN, ondansetron **OR** ondansetron (ZOFRAN) IV, sodium chloride flush  Allergies:   No Known Allergies  Social History:   Social History   Socioeconomic History  . Marital status: Widowed    Spouse name: Not on file  . Number of children: Not on file  . Years of education: Not on file  . Highest education level: Not on file  Occupational History  . Not on file  Tobacco Use  . Smoking status: Never Smoker  . Smokeless tobacco: Never Used  Vaping Use  . Vaping Use: Never used  Substance and Sexual Activity  . Alcohol use: Not Currently  . Drug use: Not Currently  . Sexual activity: Not on file  Other Topics Concern  . Not on file  Social History Narrative  . Not on file   Social Determinants of Health   Financial Resource Strain: Not on file  Food Insecurity: Not on file  Transportation Needs: Not on file  Physical Activity: Not on file  Stress: Not on file  Social Connections: Not on file  Intimate Partner Violence: Not on file    Family History:    Family History  Problem Relation Age of Onset  . Hypertension Mother   . Hypertension Father      ROS:  Please see the history of present illness.   All other ROS reviewed and negative.     Physical Exam/Data:   Vitals:   06/28/20 1000 06/28/20 1030 06/28/20 1100 06/28/20 1202  BP: 126/69 140/68 140/68 (!) 161/70  Pulse: 65 70 69 69  Resp: 14 (!) 9 10 16   Temp:    98.1 F (36.7 C)  TempSrc:    Oral  SpO2: 100% 100% 100% 100%   Weight:      Height:       No intake or output data in the 24 hours ending 06/28/20 1220 Last 3 Weights 06/28/2020 01/22/2020  Weight (lbs) 250 lb 250 lb  Weight (kg) 113.399 kg 113.399 kg     Body mass index is 35.87 kg/m.  General:  Well nourished, well developed male appearing in no acute distress.  HEENT: normal Lymph: no adenopathy Neck: no JVD Endocrine:  No thryomegaly Vascular: No carotid bruits; FA pulses 2+ bilaterally without bruits  Cardiac:  normal S1, S2; RRR; no murmur. Lungs:  clear to auscultation bilaterally, no wheezing, rhonchi  or rales  Abd: soft, nontender, no hepatomegaly  Ext: no lower extremity edema Musculoskeletal:  No deformities, BUE and BLE strength normal and equal Skin: warm and dry  Neuro:  CNs 2-12 intact, no focal abnormalities noted Psych:  Normal affect   EKG:  The EKG was personally reviewed and demonstrates: NSR, HR 92 with no acute ST abnormalities.   Telemetry:  Telemetry was personally reviewed and demonstrates: NSR, HR in 60's to 70's with no significant arrhythmias.   Relevant CV Studies:  Echocardiogram: Pending  Laboratory Data:  High Sensitivity Troponin:   Recent Labs  Lab 06/28/20 0904 06/28/20 1035  TROPONINIHS 6 7     Chemistry Recent Labs  Lab 06/28/20 0904  NA 137  K 4.2  CL 106  CO2 24  GLUCOSE 146*  BUN 23  CREATININE 0.91  CALCIUM 9.0  GFRNONAA >60  ANIONGAP 7    No results for input(s): PROT, ALBUMIN, AST, ALT, ALKPHOS, BILITOT in the last 168 hours. Hematology Recent Labs  Lab 06/28/20 0904  WBC 5.6  RBC 4.34  HGB 13.4  HCT 39.5  MCV 91.0  MCH 30.9  MCHC 33.9  RDW 13.4  PLT 192   BNPNo results for input(s): BNP, PROBNP in the last 168 hours.  DDimer No results for input(s): DDIMER in the last 168 hours.   Radiology/Studies:  DG Chest Port 1 View  Result Date: 06/28/2020 CLINICAL DATA:  Chest pain EXAM: PORTABLE CHEST 1 VIEW COMPARISON:  None. FINDINGS: The heart size and  mediastinal contours are within normal limits. Both lungs are clear. No pleural effusion or pneumothorax. The visualized skeletal structures are unremarkable. IMPRESSION: No active disease. Electronically Signed   By: Guadlupe Spanish M.D.   On: 06/28/2020 10:06     Assessment and Plan:   1. Exertional Chest Pain - He developed an episode of exertional chest pain this morning while carrying a concrete block but pain lasted for more than 4 hours. BP was elevated at the time and he reports associated diaphoresis.  - Initial ans delta Hs Troponin values have been negative and EKG is without acute ST changes. Will check Hgb A1c and FLP for risk stratification.  - Reviewed options with the patient including cardiac catheterization versus Lexiscan Myoview. He prefers to have a stress test initially. Given that it is the weekend, would anticipate cycling cardiac enzymes and obtaining an echocardiogram to assess LV function and wall motion.  If testing remains normal and he does not experience any recurrent symptoms, could likely be discharged with plans for outpatient stress testing next week. Will add ASA 81mg  daily and consider statin pending FLP results.   2. HTN - BP elevated at home this morning per his report but SBP currently in the 140's. Will continue Lisinopril 10mg  daily.    3. Hypothyroidism - On Synthroid 50 mcg daily as an outpatient.    Risk Assessment/Risk Scores:     HEAR Score (for undifferentiated chest pain):  HEAR Score: 5   For questions or updates, please contact [facility] Please consult [email or web address] for contact info under    Signed, , PA-C  06/28/2020 12:20 PM   Attending note:  Patient seen and examined.  I reviewed his records and discussed the case with Strader PA-C.  Mr. Safley presents to the ER with onset of chest pressure while lifting some concrete blocks early this morning in his garden.  He checked his blood pressure finding his  systolic in the 180s when  he had gone back inside to rest, pain persisted and he was encouraged to be evaluated by his daughter.  After approximately 4 hours duration symptoms resolved, he did receive nitroglycerin in the ER.  He does not report any recent exertional chest pain, did have an episode of nausea and emesis on Sunday evening after eating leftovers, thought that it might have been food poisoning.  He did feel somewhat fatigued and short of breath the next morning.  On examination he reports no active symptoms and appears comfortable.  He is afebrile, heart rate in the 60s and 70s in sinus rhythm by telemetry which I personally reviewed.  Systolic ranging [phone].  Lungs are clear.  Cardiac exam reveals RRR without gallop or significant murmur.  Pertinent lab work includes potassium 4.2, creatinine 0.91, high-sensitivity troponin I of 6 and 7, LDL 49, hemoglobin 13.4, platelets 192, SARS coronavirus 2 test negative.  Chest x-ray reports no acute process.  I personally reviewed his ECG which shows normal sinus rhythm.  Patient presents after an episode of chest discomfort concerning for angina, however after 4 hours of chest pain his high-sensitivity troponin I levels are normal, ECG is also normal, and chest x-ray shows no acute findings.  Recommend observation admission. Obtain echocardiogram to ensure no wall motion abnormalities.  If he remains symptom-free and all cardiac enzymes are negative, he can be discharged and we will arrange an outpatient stress test for further evaluation.  Jonelle SidleSamuel G. Ica Daye, M.D., F.A.C.C.

## 2020-06-28 NOTE — ED Notes (Signed)
Reports no longer having chest pain/discomfort since nitro. Will cont to monitor

## 2020-06-28 NOTE — ED Triage Notes (Signed)
Pt was working out in his garden this morning when all of a sudden he got sweaty, weak, and felt like a "weight was on my chest". Pt took 2 baby ASA at home PTA. Denies SOB, n/v.

## 2020-06-28 NOTE — ED Provider Notes (Signed)
[facility] EMERGENCY DEPARTMENT Provider Note   CSN: [id number] Arrival date & time: 06/28/20  0849     History Chief Complaint  Patient presents with  . Chest Pain    Bradley Acevedo is a 75 y.o. male.  Pt presents to the ED today with CP.  Pt said he was working in his garden and suddenly developed cp associated with sweats and weakness.  He was lifting some heavy concrete pads onto a 2 wheeled dolly and wheeling him to the disposal site.   He took 2 baby asa at home and checked his bp.  He said it was elevated which is not normal for him.  He took his meds as directed this am.  Pt feels like the cp is a "weight."  Pt said his pain is down to a 2 or 3.  Pt said he did have what he thought was food poisoning on Sunday the 17th.  He thinks he did not heat something up well enough and had severe n/v/d for 2 days.  Yesterday and today, he's been able to eat, but it's been lighter food.  However, this am, he had eggs and an Atkins protein bar.        Past Medical History:  Diagnosis Date  . Acid reflux   . Borderline diabetes   . Diverticulosis   . Hypertension   . Hypothyroid   . IBS (irritable bowel syndrome)   . Thyroid disease     Patient Active Problem List   Diagnosis Date Noted  . Diabetes mellitus without complication [facility])     Past Surgical History:  Procedure Laterality Date  . CIRCUMCISION    . COLONOSCOPY    . COLONOSCOPY  06/25/2016   [place], [place] by [name]  . meniscus left knee    . MENISCUS REPAIR Left 05/28/2017   [place], [place] by Dr. [name]  . TONSILLECTOMY  1953   [place], [place]       No family history on file.  Social History   Tobacco Use  . Smoking status: Never Smoker  . Smokeless tobacco: Never Used  Vaping Use  . Vaping Use: Never used  Substance Use Topics  . Alcohol use: Not Currently  . Drug use: Not Currently    Home Medications Prior to Admission medications   Medication Sig Start Date End Date Taking? Authorizing  Provider  Cholecalciferol (D3 DOTS) 50 MCG (2000 UT) TBDP Take 1 tablet by mouth daily.   Yes [provider]  esomeprazole (NEXIUM) 20 MG capsule Take 20 mg by mouth daily at 12 noon.   Yes [provider]  hyoscyamine (ANASPAZ) 0.125 MG TBDP disintergrating tablet Take 0.125-0.25 mg by mouth every 4 (four) hours as needed for bladder spasms or cramping. 01/29/20  Yes [provider]  levothyroxine (SYNTHROID) 50 MCG tablet Take 50 mcg by mouth daily. 03/22/20  Yes [provider]  lisinopril (ZESTRIL) 10 MG tablet Take 1 tablet by mouth daily. 05/22/20  Yes [provider]  metFORMIN (GLUCOPHAGE) 500 MG tablet Take 1 tablet by mouth daily. 06/19/20  Yes [provider]  ondansetron (ZOFRAN-ODT) 4 MG disintegrating tablet Take 4 mg by mouth every 8 (eight) hours as needed for nausea or vomiting. 01/29/20  Yes [provider]  Probiotic Product (PROBIOTIC PO) Take 1 tablet by mouth daily.   Yes [provider]  methocarbamol (ROBAXIN) 500 MG tablet Take 1 tablet (500 mg total) by mouth 3 (three) times daily. Patient not  taking: No sig reported 01/22/20   Triplett, Tammy, PA-C  naproxen (NAPROSYN) 500 MG tablet Take 1 tablet (500 mg total) by mouth 2 (two) times daily with a meal. Patient not taking: No sig reported 01/22/20   Pauline Aus, PA-C    Allergies    Patient has no known allergies.  Review of Systems   Review of Systems  Constitutional: Positive for diaphoresis.  Cardiovascular: Positive for chest pain.  All other systems reviewed and are negative.   Physical Exam Updated Vital Signs BP 126/69   Pulse 65   Temp (!) 97.5 F (36.4 C) (Oral)   Resp 14   Ht 5\' 10"  (1.778 m)   Wt 113.4 kg   SpO2 100%   BMI 35.87 kg/m   Physical Exam Vitals and nursing note reviewed.  Constitutional:      Appearance: He is well-developed.  HENT:     Head: Normocephalic and atraumatic.  Eyes:     Extraocular  Movements: Extraocular movements intact.     Pupils: Pupils are equal, round, and reactive to light.  Cardiovascular:     Rate and Rhythm: Normal rate and regular rhythm.     Heart sounds: Normal heart sounds.  Pulmonary:     Effort: Pulmonary effort is normal.     Breath sounds: Normal breath sounds.  Abdominal:     General: Bowel sounds are normal.     Palpations: Abdomen is soft.  Musculoskeletal:        General: Normal range of motion.     Cervical back: Normal range of motion and neck supple.  Skin:    General: Skin is warm.     Capillary Refill: Capillary refill takes less than 2 seconds.  Neurological:     General: No focal deficit present.     Mental Status: He is alert and oriented to person, place, and time.     ED Results / Procedures / Treatments   Labs (all labs ordered are listed, but only abnormal results are displayed) Labs Reviewed  BASIC METABOLIC PANEL - Abnormal; Notable for the following components:      Result Value   Glucose, Bld 146 (*)    All other components within normal limits  RESP PANEL BY RT-PCR (FLU A&B, COVID) ARPGX2  CBC  TROPONIN I (HIGH SENSITIVITY)  TROPONIN I (HIGH SENSITIVITY)    EKG EKG Interpretation  Date/Time:  Friday June 28 2020 09:00:58 EDT Ventricular Rate:  92 PR Interval:  169 QRS Duration: 92 QT Interval:  343 QTC Calculation: 425 R Axis:   66 Text Interpretation: Sinus rhythm Confirmed by [id number] [phone]) on 06/28/2020 9:34:29 AM   Radiology DG Chest Port 1 View  Result Date: 06/28/2020 CLINICAL DATA:  Chest pain EXAM: PORTABLE CHEST 1 VIEW COMPARISON:  None. FINDINGS: The heart size and mediastinal contours are within normal limits. Both lungs are clear. No pleural effusion or pneumothorax. The visualized skeletal structures are unremarkable. IMPRESSION: No active disease. Electronically Signed   By: 06/30/2020 M.D.   On: 06/28/2020 10:06    Procedures Procedures   Medications Ordered in  ED Medications  nitroGLYCERIN (NITROSTAT) SL tablet 0.4 mg (0.4 mg Sublingual Given 06/28/20 06/30/20)    ED Course  I have reviewed the triage vital signs and the nursing notes.  Pertinent labs & imaging results that were available during my care of the patient were reviewed by me and considered in my medical decision making (see chart for details).    MDM  Rules/Calculators/A&P                         Initial cardiac work up negative.  CP gone after nitro.  Heart score of 6.  BP improved after nitro.  Pt d/w Dr. Sherryll Burger (triad) who will admit for obs.   Final Clinical Impression(s) / ED Diagnoses Final diagnoses:  Chest pain, unspecified type    Rx / DC Orders ED Discharge Orders    None       Jacalyn Lefevre, MD 06/28/20 [phone]

## 2020-06-28 NOTE — ED Notes (Signed)
ED Provider at bedside. 

## 2020-06-28 NOTE — Progress Notes (Signed)
*  PRELIMINARY RESULTS* Echocardiogram 2D Echocardiogram has been performed.  Bradley Acevedo 06/28/2020, 1:47 PM

## 2020-06-29 DIAGNOSIS — R079 Chest pain, unspecified: Secondary | ICD-10-CM | POA: Diagnosis not present

## 2020-06-29 DIAGNOSIS — R0789 Other chest pain: Secondary | ICD-10-CM | POA: Diagnosis not present

## 2020-06-29 LAB — BASIC METABOLIC PANEL
Anion gap: 7 (ref 5–15)
BUN: 17 mg/dL (ref 8–23)
CO2: 26 mmol/L (ref 22–32)
Calcium: 9.1 mg/dL (ref 8.9–10.3)
Chloride: 103 mmol/L (ref 98–111)
Creatinine, Ser: 0.98 mg/dL (ref 0.61–1.24)
GFR, Estimated: >60 mL/min (ref >60)
Glucose, Bld: 147 mg/dL — ABNORMAL HIGH (ref 70–99)
Potassium: 4.2 mmol/L (ref 3.5–5.1)
Sodium: 136 mmol/L (ref 135–145)

## 2020-06-29 LAB — CBC
HCT: 41.5 % (ref 39.0–52.0)
Hemoglobin: 13.8 g/dL (ref 13.0–17.0)
MCH: 30.2 pg (ref 26.0–34.0)
MCHC: 33.3 g/dL (ref 30.0–36.0)
MCV: 90.8 fL (ref 80.0–100.0)
Platelets: 209 10*3/uL (ref 150–400)
RBC: 4.57 MIL/uL (ref 4.22–5.81)
RDW: 13.5 % (ref 11.5–15.5)
WBC: 6 10*3/uL (ref 4.0–10.5)
nRBC: 0 % (ref 0.0–0.2)

## 2020-06-29 LAB — MAGNESIUM: Magnesium: 1.8 mg/dL (ref 1.7–2.4)

## 2020-06-29 MED ORDER — PANTOPRAZOLE SODIUM 40 MG PO TBEC: 40.0000 mg | DELAYED_RELEASE_TABLET | Freq: Every day | ORAL | 2 refills | Status: DC

## 2020-06-29 MED ORDER — ASPIRIN 81 MG PO TBEC: 81.0000 mg | DELAYED_RELEASE_TABLET | Freq: Every day | ORAL | 11 refills | Status: AC

## 2020-06-29 MED ORDER — NITROGLYCERIN 0.4 MG SL SUBL: 0.4000 mg | SUBLINGUAL_TABLET | SUBLINGUAL | 12 refills | Status: DC | PRN

## 2020-06-29 NOTE — Progress Notes (Signed)
Discharge instructions given patient verbalized understanding. Discharged via wheelchair off of unit. IV removed prior to discharge.

## 2020-06-29 NOTE — Discharge Summary (Signed)
Physician Discharge Summary  Bradley Acevedo CBS:[id number] DOB: December 17, 1945 DOA: 06/28/2020  PCP: Shawnie Dapper, PA-C  Admit date: 06/28/2020  Discharge date: 06/29/2020  Admitted From:Home  Disposition:  Home  Recommendations for Outpatient Follow-up:  1. Follow up with PCP in 1-2 weeks 2. Continue now Protonix 40 mg also Nexium 20 mg daily for treatment of acid reflux which may have been the likely source of chest pain 3. Follow-up with cardiology as recommended for consideration of outpatient stress test 4. Continue on aspirin 81 mg daily as well as nitroglycerin as prescribed for as needed chest pain 5. Continue other home medications as prior  Home Health: None  Equipment/Devices: None  Discharge Condition:Stable  CODE STATUS: Full  Diet recommendation: Heart Healthy/carb modified  Brief/Interim Summary: Bradley Acevedo is a 75 y.o. male with medical history significant for prior tobacco use, DM2, obesity, hypertension, hypothyroidism, and GERD who presented to the ED sudden onset of substernal chest pressure along with sweats and weakness that developed as he was working in his garden and moving heavy rocks.  Patient was admitted for evaluation of chest pain in the setting of significant risk factors and heart score of 6.  His troponins were negative and EKG was negative as well and he had no further significant chest pain during the course of this admission.  He did have a 2D echocardiogram evaluation as noted below with no significant wall motion abnormalities and LVEF 55-60% noted.  He has been seen by cardiology with recommendations for stress testing in the outpatient setting given that his 2D echocardiogram looks okay.  He has no other significant complaints or concerns and will be switched to Protonix 40 mg daily to help better control his GERD symptoms in the setting of recent food poisoning.  This is the likely cause of his atypical chest pain.  He is overall stable for  discharge today.  Discharge Diagnoses:  Active Problems:   Chest pain  Principal discharge diagnosis: Chest pain likely in setting of GERD.  Discharge Instructions  Discharge Instructions    Ambulatory referral to Cardiology   Complete by: As directed    Outpt stress test.   Diet - low sodium heart healthy   Complete by: As directed    Increase activity slowly   Complete by: As directed      Allergies as of 06/29/2020   No Known Allergies     Medication List    STOP taking these medications   esomeprazole 20 MG capsule Commonly known as: NEXIUM Replaced by: pantoprazole 40 MG tablet   methocarbamol 500 MG tablet Commonly known as: ROBAXIN   naproxen 500 MG tablet Commonly known as: NAPROSYN     TAKE these medications   aspirin 81 MG EC tablet Take 1 tablet (81 mg total) by mouth daily. Swallow whole. Start taking on: June 30, 2020   D3 Dots 50 MCG (2000 UT) Tbdp Generic drug: Cholecalciferol Take 1 tablet by mouth daily.   hyoscyamine 0.125 MG Tbdp disintergrating tablet Commonly known as: ANASPAZ Take 0.125-0.25 mg by mouth every 4 (four) hours as needed for bladder spasms or cramping.   levothyroxine 50 MCG tablet Commonly known as: SYNTHROID Take 50 mcg by mouth daily.   lisinopril 10 MG tablet Commonly known as: ZESTRIL Take 1 tablet by mouth daily.   metFORMIN 500 MG tablet Commonly known as: GLUCOPHAGE Take 1 tablet by mouth daily.   nitroGLYCERIN 0.4 MG SL tablet Commonly known as: NITROSTAT Place 1 tablet (  0.4 mg total) under the tongue every 5 (five) minutes as needed for chest pain.   ondansetron 4 MG disintegrating tablet Commonly known as: ZOFRAN-ODT Take 4 mg by mouth every 8 (eight) hours as needed for nausea or vomiting.   pantoprazole 40 MG tablet Commonly known as: PROTONIX Take 1 tablet (40 mg total) by mouth daily. Start taking on: June 30, 2020 Replaces: esomeprazole 20 MG capsule   PROBIOTIC PO Take 1 tablet by mouth  daily.       Follow-up Information    Shawnie Dapper, PA-C. Schedule an appointment as soon as possible for a visit in 1 week(s).   Specialties: Physician Assistant, Internal Medicine Contact information: [address] [place] [address] [phone]              No Known Allergies  Consultations:  Cardiology   Procedures/Studies: [facility] Chest Port 1 View  Result Date: 06/28/2020 CLINICAL DATA:  Chest pain EXAM: PORTABLE CHEST 1 VIEW COMPARISON:  None. FINDINGS: The heart size and mediastinal contours are within normal limits. Both lungs are clear. No pleural effusion or pneumothorax. The visualized skeletal structures are unremarkable. IMPRESSION: No active disease. Electronically Signed   By: Guadlupe Spanish M.D.   On: 06/28/2020 10:06   ECHOCARDIOGRAM COMPLETE  Result Date: 06/28/2020    ECHOCARDIOGRAM REPORT   Patient Name:   Bradley Acevedo Date of Exam: 06/28/2020 Medical Rec #:  [id number]       Height:       70.0 in Accession #:    [id number]      Weight:       250.0 lb Date of Birth:  1945/09/03        BSA:          2.295 m Patient Age:    74 years        BP:           161/70 mmHg Patient Gender: M               HR:           69 bpm. [facility] Procedure: 2D Echo Indications:    Chest Pain R07.9  History:        Patient has no prior history of Echocardiogram examinations.                 Signs/Symptoms:Chest Pain; Risk Factors:Hypertension, Non-Smoker                 and Diabetes. Thyroid Disease.  Sonographer:    Jeryl Columbia RDCS (AE) Referring Phys: [phone] Lamont Dowdy [facility] IMPRESSIONS  1. Left ventricular ejection fraction, by estimation, is 55 to 60%. The left ventricle has normal function. The left ventricle has no regional wall motion abnormalities. There is mild left ventricular hypertrophy. Left ventricular diastolic parameters were normal.  2. Right ventricular systolic function is normal. The right ventricular size is normal. Tricuspid  regurgitation signal is inadequate for assessing PA pressure.  3. The mitral valve is grossly normal. Trivial mitral valve regurgitation.  4. The aortic valve is tricuspid. Aortic valve regurgitation is not visualized.  5. The inferior vena cava is normal in size with greater than 50% respiratory variability, suggesting right atrial pressure of 3 mmHg. FINDINGS  Left Ventricle: Left ventricular ejection fraction, by estimation, is 55 to 60%. The left ventricle has normal function. The left ventricle has no regional wall motion abnormalities. The left ventricular internal cavity size was  normal in size. There is  mild left ventricular hypertrophy. Left ventricular diastolic parameters were normal. Right Ventricle: The right ventricular size is normal. No increase in right ventricular wall thickness. Right ventricular systolic function is normal. Tricuspid regurgitation signal is inadequate for assessing PA pressure. Left Atrium: Left atrial size was normal in size. Right Atrium: Right atrial size was normal in size. Pericardium: There is no evidence of pericardial effusion. Mitral Valve: The mitral valve is grossly normal. Trivial mitral valve regurgitation. Tricuspid Valve: The tricuspid valve is grossly normal. Tricuspid valve regurgitation is trivial. Aortic Valve: The aortic valve is tricuspid. There is mild aortic valve annular calcification. Aortic valve regurgitation is not visualized. Pulmonic Valve: The pulmonic valve was grossly normal. Pulmonic valve regurgitation is trivial. Aorta: The aortic root is normal in size and structure. Venous: The inferior vena cava is normal in size with greater than 50% respiratory variability, suggesting right atrial pressure of 3 mmHg. IAS/Shunts: No atrial level shunt detected by color flow Doppler.  LEFT VENTRICLE PLAX 2D LVIDd:         4.11 cm  Diastology LVIDs:         2.92 cm  LV e' medial:    8.16 cm/s LV PW:         1.21 cm  LV E/e' medial:  11.7 LV IVS:        1.31  cm  LV e' lateral:   13.50 cm/s LVOT diam:     1.90 cm  LV E/e' lateral: 7.1 LVOT Area:     2.84 cm  RIGHT VENTRICLE RV S prime:     15.00 cm/s TAPSE (M-mode): 3.0 cm LEFT ATRIUM             Index       RIGHT ATRIUM           Index LA diam:        3.90 cm 1.70 cm/m  RA Area:     17.40 cm LA Vol (A2C):   43.2 ml 18.82 ml/m RA Volume:   45.80 ml  19.96 ml/m LA Vol (A4C):   61.1 ml 26.62 ml/m LA Biplane Vol: 52.5 ml 22.88 ml/m   AORTA Ao Root diam: 3.30 cm MITRAL VALVE MV Area (PHT): 2.79 cm    SHUNTS MV Decel Time: 272 msec    Systemic Diam: 1.90 cm MV E velocity: 95.20 cm/s MV A velocity: 88.60 cm/s MV E/A ratio:  1.07 Nona Dell MD Electronically signed by Nona Dell MD Signature Date/Time: 06/28/2020/5:16:14 PM    Final       Discharge Exam: Vitals:   06/29/20 0550 06/29/20 0835  BP: (!) 144/76 (!) 145/70  Pulse: 67   Resp: 19   Temp: (!) 97.5 F (36.4 C)   SpO2: 100%    Vitals:   06/28/20 1936 06/28/20 2350 06/29/20 0550 06/29/20 0835  BP: 136/76 139/71 (!) 144/76 (!) 145/70  Pulse: 69 73 67   Resp: Temp: 97.8 F (36.6 C) 97.9 F (36.6 C) (!) 97.5 F (36.4 C)   TempSrc: Oral Oral    SpO2: 99% 99% 100%   Weight:      Height:        General: Pt is alert, awake, not in acute distress Cardiovascular: RRR, S1/S2 +, no rubs, no gallops Respiratory: CTA bilaterally, no wheezing, no rhonchi Abdominal: Soft, NT, ND, bowel sounds + Extremities: no edema, no cyanosis    The results of significant diagnostics from  this hospitalization (including imaging, microbiology, ancillary and laboratory) are listed below for reference.     Microbiology: Recent Results (from the past 240 hour(s))  Resp Panel by RT-PCR (Flu A&B, Covid) Nasopharyngeal Swab     Status: None   Collection Time: 06/28/20 10:26 AM   Specimen: Nasopharyngeal Swab; Nasopharyngeal(NP) swabs in vial transport medium  Result Value Ref Range Status   SARS Coronavirus 2 by RT PCR NEGATIVE  NEGATIVE Final    Comment: (NOTE) SARS-CoV-2 target nucleic acids are NOT DETECTED.  The SARS-CoV-2 RNA is generally detectable in upper respiratory specimens during the acute phase of infection. The lowest concentration of SARS-CoV-2 viral copies this assay can detect is 138 copies/mL. A negative result does not preclude SARS-Cov-2 infection and should not be used as the sole basis for treatment or other patient management decisions. A negative result may occur with  improper specimen collection/handling, submission of specimen other than nasopharyngeal swab, presence of viral mutation(s) within the areas targeted by this assay, and inadequate number of viral copies(<138 copies/mL). A negative result must be combined with clinical observations, patient history, and epidemiological information. The expected result is Negative.  Fact Sheet for Patients:  BloggerCourse.com  Fact Sheet for Healthcare Providers:  SeriousBroker.it  This test is no t yet approved or cleared by the [place] FDA and  has been authorized for detection and/or diagnosis of SARS-CoV-2 by FDA under an Emergency Use Authorization (EUA). This EUA will remain  in effect (meaning this test can be used) for the duration of the COVID-19 declaration under Section 564(b)(1) of the Act, 21 U.S.C.section 360bbb-3(b)(1), unless the authorization is terminated  or revoked sooner.       Influenza A by PCR NEGATIVE NEGATIVE Final   Influenza B by PCR NEGATIVE NEGATIVE Final    Comment: (NOTE) The Xpert Xpress SARS-CoV-2/FLU/RSV plus assay is intended as an aid in the diagnosis of influenza from Nasopharyngeal swab specimens and should not be used as a sole basis for treatment. Nasal washings and aspirates are unacceptable for Xpert Xpress SARS-CoV-2/FLU/RSV testing.  Fact Sheet for Patients: BloggerCourse.com  Fact Sheet for Healthcare  Providers: SeriousBroker.it  This test is not yet approved or cleared by the [place] FDA and has been authorized for detection and/or diagnosis of SARS-CoV-2 by FDA under an Emergency Use Authorization (EUA). This EUA will remain in effect (meaning this test can be used) for the duration of the COVID-19 declaration under Section 564(b)(1) of the Act, 21 U.S.C. section 360bbb-3(b)(1), unless the authorization is terminated or revoked.  Performed at [facility], [address], [place], [place] [address]      Labs: BNP (last 3 results) No results for input(s): BNP in the last 8760 hours. Basic Metabolic Panel: Recent Labs  Lab 06/28/20 0904 06/29/20 0506  NA 137 136  K 4.2 4.2  CL 106 103  CO2 24 26  GLUCOSE 146* 147*  BUN 23 17  CREATININE 0.91 0.98  CALCIUM 9.0 9.1  MG  --  1.8   Liver Function Tests: No results for input(s): AST, ALT, ALKPHOS, BILITOT, PROT, ALBUMIN in the last 168 hours. No results for input(s): LIPASE, AMYLASE in the last 168 hours. No results for input(s): AMMONIA in the last 168 hours. CBC: Recent Labs  Lab 06/28/20 0904 06/29/20 0506  WBC 5.6 6.0  HGB 13.4 13.8  HCT 39.5 41.5  MCV 91.0 90.8  PLT 192 209   Cardiac Enzymes: No results for input(s): CKTOTAL, CKMB, CKMBINDEX, TROPONINI in  the last 168 hours. BNP: Invalid input(s): POCBNP CBG: No results for input(s): GLUCAP in the last 168 hours. D-Dimer No results for input(s): DDIMER in the last 72 hours. Hgb A1c Recent Labs    06/28/20 0904  HGBA1C 6.8*   Lipid Profile Recent Labs    06/28/20 1035  CHOL 98  HDL 29*  LDLCALC 49  TRIG 259  CHOLHDL 3.4   Thyroid function studies No results for input(s): TSH, T4TOTAL, T3FREE, THYROIDAB in the last 72 hours.  Invalid input(s): FREET3 Anemia work up No results for input(s): VITAMINB12, FOLATE, FERRITIN, TIBC, IRON, RETICCTPCT in the last 72 hours. Urinalysis    Component Value Date/Time    COLORURINE STRAW (A) 01/22/2020 1720   APPEARANCEUR CLEAR 01/22/2020 1720   LABSPEC 1.012 01/22/2020 1720   PHURINE 6.0 01/22/2020 1720   GLUCOSEU 150 (A) 01/22/2020 1720   HGBUR NEGATIVE 01/22/2020 1720   BILIRUBINUR NEGATIVE 01/22/2020 1720   KETONESUR NEGATIVE 01/22/2020 1720   PROTEINUR NEGATIVE 01/22/2020 1720   NITRITE NEGATIVE 01/22/2020 1720   LEUKOCYTESUR NEGATIVE 01/22/2020 1720   Sepsis Labs Invalid input(s): PROCALCITONIN,  WBC,  LACTICIDVEN Microbiology Recent Results (from the past 240 hour(s))  Resp Panel by RT-PCR (Flu A&B, Covid) Nasopharyngeal Swab     Status: None   Collection Time: 06/28/20 10:26 AM   Specimen: Nasopharyngeal Swab; Nasopharyngeal(NP) swabs in vial transport medium  Result Value Ref Range Status   SARS Coronavirus 2 by RT PCR NEGATIVE NEGATIVE Final    Comment: (NOTE) SARS-CoV-2 target nucleic acids are NOT DETECTED.  The SARS-CoV-2 RNA is generally detectable in upper respiratory specimens during the acute phase of infection. The lowest concentration of SARS-CoV-2 viral copies this assay can detect is 138 copies/mL. A negative result does not preclude SARS-Cov-2 infection and should not be used as the sole basis for treatment or other patient management decisions. A negative result may occur with  improper specimen collection/handling, submission of specimen other than nasopharyngeal swab, presence of viral mutation(s) within the areas targeted by this assay, and inadequate number of viral copies(<138 copies/mL). A negative result must be combined with clinical observations, patient history, and epidemiological information. The expected result is Negative.  Fact Sheet for Patients:  BloggerCourse.com  Fact Sheet for Healthcare Providers:  SeriousBroker.it  This test is no t yet approved or cleared by the [place] FDA and  has been authorized for detection and/or diagnosis of  SARS-CoV-2 by FDA under an Emergency Use Authorization (EUA). This EUA will remain  in effect (meaning this test can be used) for the duration of the COVID-19 declaration under Section 564(b)(1) of the Act, 21 U.S.C.section 360bbb-3(b)(1), unless the authorization is terminated  or revoked sooner.       Influenza A by PCR NEGATIVE NEGATIVE Final   Influenza B by PCR NEGATIVE NEGATIVE Final    Comment: (NOTE) The Xpert Xpress SARS-CoV-2/FLU/RSV plus assay is intended as an aid in the diagnosis of influenza from Nasopharyngeal swab specimens and should not be used as a sole basis for treatment. Nasal washings and aspirates are unacceptable for Xpert Xpress SARS-CoV-2/FLU/RSV testing.  Fact Sheet for Patients: BloggerCourse.com  Fact Sheet for Healthcare Providers: SeriousBroker.it  This test is not yet approved or cleared by the [place] FDA and has been authorized for detection and/or diagnosis of SARS-CoV-2 by FDA under an Emergency Use Authorization (EUA). This EUA will remain in effect (meaning this test can be used) for the duration of the COVID-19 declaration under Section 564(b)(1) of  the Act, 21 U.S.C. section 360bbb-3(b)(1), unless the authorization is terminated or revoked.  Performed at [facility], [address] [place], [place] [id number]      Time coordinating discharge: 35 minutes  SIGNED:   Erick BlinksPratik D Demarie Hyneman, DO [facility] 06/29/2020, 9:18 AM  If 7PM-7AM, please contact night-coverage [email or web address]

## 2020-07-01 ENCOUNTER — Other Ambulatory Visit: Payer: Self-pay

## 2020-07-01 DIAGNOSIS — R079 Chest pain, unspecified: Secondary | ICD-10-CM

## 2020-07-09 ENCOUNTER — Encounter (HOSPITAL_COMMUNITY)
Admission: RE | Admit: 2020-07-09 | Discharge: 2020-07-09 | Disposition: A | Discharge status: Home / Self Care | Payer: Medicare Other | Source: Ambulatory Visit | Attending: Cardiology | Admitting: Cardiology

## 2020-07-09 ENCOUNTER — Encounter (HOSPITAL_COMMUNITY): Payer: Self-pay

## 2020-07-09 ENCOUNTER — Ambulatory Visit (HOSPITAL_COMMUNITY)
Admission: RE | Admit: 2020-07-09 | Discharge: 2020-07-09 | Disposition: A | Discharge status: Home / Self Care | Payer: Medicare Other | Source: Ambulatory Visit | Attending: Cardiology | Admitting: Cardiology

## 2020-07-09 DIAGNOSIS — R079 Chest pain, unspecified: Secondary | ICD-10-CM | POA: Insufficient documentation

## 2020-07-09 MED ORDER — REGADENOSON 0.4 MG/5ML IV SOLN
INTRAVENOUS | Status: AC
  Administered 2020-07-09: 0.4 mg via INTRAVENOUS
  Filled 2020-07-09: qty 5

## 2020-07-09 MED ORDER — TECHNETIUM TC 99M TETROFOSMIN IV KIT
10.0000 | PACK | Freq: Once | INTRAVENOUS | Status: AC | PRN
  Administered 2020-07-09: 9 via INTRAVENOUS

## 2020-07-09 MED ORDER — TECHNETIUM TC 99M TETROFOSMIN IV KIT
30.0000 | PACK | Freq: Once | INTRAVENOUS | Status: AC | PRN
  Administered 2020-07-09: 30 via INTRAVENOUS

## 2020-07-09 MED ORDER — SODIUM CHLORIDE FLUSH 0.9 % IV SOLN
INTRAVENOUS | Status: AC
  Administered 2020-07-09: 10 mL via INTRAVENOUS
  Filled 2020-07-09: qty 10

## 2020-07-10 ENCOUNTER — Telehealth: Payer: Self-pay

## 2020-07-10 LAB — NM MYOCAR MULTI W/SPECT W/WALL MOTION / EF
LV dias vol: 104 mL (ref 62–150)
LV sys vol: 44 mL
Peak HR: 109 {beats}/min
RATE: 0.59
Rest HR: 70 {beats}/min
SDS: 3
SRS: 0
SSS: 3
TID: 0.96

## 2020-07-10 NOTE — Telephone Encounter (Signed)
-----   Message from Jonelle Sidle, MD sent at 07/10/2020  2:58 PM EDT ----- Results reviewed.  Stress test ordered after recent inpatient consultation.  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  He has a visit pending for follow-up, we can discuss further treatment options based on how he has been doing symptomatically.

## 2020-07-10 NOTE — Telephone Encounter (Signed)
Pt was notified and verbalized agreement. Pt confirmed f/u appt with Dr. [name] on 07/16/2020. Pt had no questions or concerns at this time.

## 2020-07-15 NOTE — Progress Notes (Signed)
Cardiology Office Note  Date: 07/16/2020   ID: Bradley Acevedo, DOB [birth year], MRN [id number]  PCP:  Shawnie Dapper, PA-C  Cardiologist:  Nona Dell, MD Electrophysiologist:  None   Chief Complaint  Patient presents with  . Cardiac follow-up    History of Present Illness: Bradley Acevedo is a 75 y.o. male presenting for post hospital follow-up.  He was seen in consultation in late April after an episode of chest pain concerning for potential angina.  High-sensitivity troponin I levels were normal after 4 hours of chest discomfort, ECG also without acute findings.  LVEF 55 to 60% without regional wall motion abnormalities.  He underwent a Lexiscan Myoview subsequently which showed evidence of inferior/inferoapical infarct scar and mild peri-infarct ischemia.  He presents today stating that he has had intermittent episodes of chest pressure consistent with angina, occurring at lower level activity.  We discussed the results of his stress test and reviewed the risks and benefits of proceeding with a diagnostic cardiac catheterization to better assess coronary anatomy for potential revascularization options.  He is in agreement to proceed.  His last heart catheterization was in [place] back in 2007 at which point he had no significant obstructive CAD.  I reviewed his medications which are listed below.  We will plan to add low-dose beta-blocker and also low-dose statin.  His most recent LDL was only 49.  Past Medical History:  Diagnosis Date  . Acid reflux   . Borderline diabetes   . Diabetes mellitus without complication (HCC)   . Diverticulosis   . Hypertension   . Hypothyroid   . IBS (irritable bowel syndrome)   . Thyroid disease     Past Surgical History:  Procedure Laterality Date  . CIRCUMCISION    . COLONOSCOPY    . COLONOSCOPY  06/25/2016   [place], [place] by [name]  . meniscus left knee    . MENISCUS REPAIR Left 05/28/2017   [place], [place] by Dr. [name]   . TONSILLECTOMY  1953   [place], [place]    Current Outpatient Medications  Medication Sig Dispense Refill  . aspirin EC 81 MG EC tablet Take 1 tablet (81 mg total) by mouth daily. Swallow whole. 30 tablet 11  . Cholecalciferol (D3 DOTS) 50 MCG (2000 UT) TBDP Take 1 tablet by mouth daily.    . hyoscyamine (ANASPAZ) 0.125 MG TBDP disintergrating tablet Take 0.125-0.25 mg by mouth every 4 (four) hours as needed for bladder spasms or cramping.    Marland Kitchen levothyroxine (SYNTHROID) 50 MCG tablet Take 50 mcg by mouth daily.    Marland Kitchen lisinopril (ZESTRIL) 10 MG tablet Take 1 tablet by mouth daily.    . metFORMIN (GLUCOPHAGE) 500 MG tablet Take 1 tablet by mouth daily.    . nitroGLYCERIN (NITROSTAT) 0.4 MG SL tablet Place 1 tablet (0.4 mg total) under the tongue every 5 (five) minutes as needed for chest pain. 30 tablet 12  . ondansetron (ZOFRAN-ODT) 4 MG disintegrating tablet Take 4 mg by mouth every 8 (eight) hours as needed for nausea or vomiting.    . pantoprazole (PROTONIX) 40 MG tablet Take 1 tablet (40 mg total) by mouth daily. 30 tablet 2  . Probiotic Product (PROBIOTIC PO) Take 1 tablet by mouth daily.     No current facility-administered medications for this visit.   Allergies:  Patient has no known allergies.   Social History: The patient  reports that he has never smoked. He has never used smokeless tobacco. He reports  previous alcohol use. He reports previous drug use.   Family History: The patient's family history includes Hypertension in his father and mother.   ROS: No palpitations, dizziness, or syncope.  Physical Exam: VS:  BP (!) 148/78   Pulse 96   Ht 5\' 10"  (1.778 m)   Wt 241 lb 3.2 oz (109.4 kg)   SpO2 98%   BMI 34.61 kg/m , BMI Body mass index is 34.61 kg/m.  Wt Readings from Last 3 Encounters:  07/16/20 241 lb 3.2 oz (109.4 kg)  06/28/20 250 lb (113.4 kg)  01/22/20 250 lb (113.4 kg)    General: Patient appears comfortable at rest. HEENT: Conjunctiva and lids normal,  wearing a mask. Neck: Supple, no elevated JVP or carotid bruits, no thyromegaly. Lungs: Clear to auscultation, nonlabored breathing at rest. Cardiac: Regular rate and rhythm, no S3 or significant systolic murmur, no pericardial rub. Abdomen: Soft, nontender, bowel sounds present. Extremities: No pitting edema, distal pulses 2+. Skin: Warm and dry. Musculoskeletal: No kyphosis. Neuropsychiatric: Alert and oriented x3, affect grossly appropriate.  ECG:  An ECG dated 06/28/2020 was personally reviewed today and demonstrated:  Normal sinus rhythm.  Recent Labwork: 06/29/2020: BUN 17; Creatinine, Ser 0.98; Hemoglobin 13.8; Magnesium 1.8; Platelets 209; Potassium 4.2; Sodium 136     Component Value Date/Time   CHOL 98 06/28/2020 1035   TRIG 101 06/28/2020 1035   HDL 29 (L) 06/28/2020 1035   CHOLHDL 3.4 06/28/2020 1035   VLDL 20 06/28/2020 1035   LDLCALC 49 06/28/2020 1035    Other Studies Reviewed Today:  Echocardiogram 06/28/2020: 1. Left ventricular ejection fraction, by estimation, is 55 to 60%. The  left ventricle has normal function. The left ventricle has no regional  wall motion abnormalities. There is mild left ventricular hypertrophy.  Left ventricular diastolic parameters  were normal.  2. Right ventricular systolic function is normal. The right ventricular  size is normal. Tricuspid regurgitation signal is inadequate for assessing  PA pressure.  3. The mitral valve is grossly normal. Trivial mitral valve  regurgitation.  4. The aortic valve is tricuspid. Aortic valve regurgitation is not  visualized.  5. The inferior vena cava is normal in size with greater than 50%  respiratory variability, suggesting right atrial pressure of 3 mmHg.   Lexiscan Myoview 07/09/2020:  There was no ST segment deviation noted during stress.  Findings consistent with prior inferionr/inferoapical myocardial infarction with mild peri-infarct ischemia.  This is a low risk study.  The  left ventricular ejection fraction is normal (55-65%).  Assessment and Plan:  1.  Accelerating angina with recent Lexiscan Myoview indicating inferior/inferoapical scar with mild peri-infarct ischemia, new diagnosis of potential ischemic heart disease.  No evidence of ACS at ER visit in late April by ECG or high-sensitivity troponin I levels.  We have discussed the risks and benefits of a diagnostic cardiac catheterization and he is in agreement to proceed.  Continue aspirin and lisinopril, add Toprol-XL 25 mg daily and Crestor 5 mg daily (recent LDL 49).  He also has nitroglycerin available.  2.  Essential hypertension, currently on lisinopril.  Adding low-dose beta-blocker as well.  3.  Type 2 diabetes mellitus, currently on Glucophage with follow-up by PCP.  Likely good candidate for SGLT2 inhibitor.  Medication Adjustments/Labs and Tests Ordered: Current medicines are reviewed at length with the patient today.  Concerns regarding medicines are outlined above.   Tests Ordered: No orders of the defined types were placed in this encounter.   Medication Changes: No  orders of the defined types were placed in this encounter.   Disposition:  Follow up after procedure.  Signed, Jonelle Sidle, MD, [facility] 07/16/2020 8:50 AM    [facility] at [facility] [address], [place], [place] [address] Phone: [phone]; Fax: [phone]

## 2020-07-15 NOTE — H&P (View-Only) (Signed)
Cardiology Office Note  Date: 07/16/2020   ID: Bradley Acevedo, DOB [birth year], MRN [id number]  PCP:  Shawnie Dapper, PA-C  Cardiologist:  Nona Dell, MD Electrophysiologist:  None   Chief Complaint  Patient presents with  . Cardiac follow-up    History of Present Illness: Bradley Acevedo is a 75 y.o. male presenting for post hospital follow-up.  He was seen in consultation in late April after an episode of chest pain concerning for potential angina.  High-sensitivity troponin I levels were normal after 4 hours of chest discomfort, ECG also without acute findings.  LVEF 55 to 60% without regional wall motion abnormalities.  He underwent a Lexiscan Myoview subsequently which showed evidence of inferior/inferoapical infarct scar and mild peri-infarct ischemia.  He presents today stating that he has had intermittent episodes of chest pressure consistent with angina, occurring at lower level activity.  We discussed the results of his stress test and reviewed the risks and benefits of proceeding with a diagnostic cardiac catheterization to better assess coronary anatomy for potential revascularization options.  He is in agreement to proceed.  His last heart catheterization was in [place] back in 2007 at which point he had no significant obstructive CAD.  I reviewed his medications which are listed below.  We will plan to add low-dose beta-blocker and also low-dose statin.  His most recent LDL was only 49.  Past Medical History:  Diagnosis Date  . Acid reflux   . Borderline diabetes   . Diabetes mellitus without complication (HCC)   . Diverticulosis   . Hypertension   . Hypothyroid   . IBS (irritable bowel syndrome)   . Thyroid disease     Past Surgical History:  Procedure Laterality Date  . CIRCUMCISION    . COLONOSCOPY    . COLONOSCOPY  06/25/2016   [place], [place] by [name]  . meniscus left knee    . MENISCUS REPAIR Left 05/28/2017   [place], [place] by Dr. [name]   . TONSILLECTOMY  1953   [place], [place]    Current Outpatient Medications  Medication Sig Dispense Refill  . aspirin EC 81 MG EC tablet Take 1 tablet (81 mg total) by mouth daily. Swallow whole. 30 tablet 11  . Cholecalciferol (D3 DOTS) 50 MCG (2000 UT) TBDP Take 1 tablet by mouth daily.    . hyoscyamine (ANASPAZ) 0.125 MG TBDP disintergrating tablet Take 0.125-0.25 mg by mouth every 4 (four) hours as needed for bladder spasms or cramping.    Marland Kitchen levothyroxine (SYNTHROID) 50 MCG tablet Take 50 mcg by mouth daily.    Marland Kitchen lisinopril (ZESTRIL) 10 MG tablet Take 1 tablet by mouth daily.    . metFORMIN (GLUCOPHAGE) 500 MG tablet Take 1 tablet by mouth daily.    . nitroGLYCERIN (NITROSTAT) 0.4 MG SL tablet Place 1 tablet (0.4 mg total) under the tongue every 5 (five) minutes as needed for chest pain. 30 tablet 12  . ondansetron (ZOFRAN-ODT) 4 MG disintegrating tablet Take 4 mg by mouth every 8 (eight) hours as needed for nausea or vomiting.    . pantoprazole (PROTONIX) 40 MG tablet Take 1 tablet (40 mg total) by mouth daily. 30 tablet 2  . Probiotic Product (PROBIOTIC PO) Take 1 tablet by mouth daily.     No current facility-administered medications for this visit.   Allergies:  Patient has no known allergies.   Social History: The patient  reports that he has never smoked. He has never used smokeless tobacco. He reports  previous alcohol use. He reports previous drug use.   Family History: The patient's family history includes Hypertension in his father and mother.   ROS: No palpitations, dizziness, or syncope.  Physical Exam: VS:  BP (!) 148/78   Pulse 96   Ht 5\' 10"  (1.778 m)   Wt 241 lb 3.2 oz (109.4 kg)   SpO2 98%   BMI 34.61 kg/m , BMI Body mass index is 34.61 kg/m.  Wt Readings from Last 3 Encounters:  07/16/20 241 lb 3.2 oz (109.4 kg)  06/28/20 250 lb (113.4 kg)  01/22/20 250 lb (113.4 kg)    General: Patient appears comfortable at rest. HEENT: Conjunctiva and lids normal,  wearing a mask. Neck: Supple, no elevated JVP or carotid bruits, no thyromegaly. Lungs: Clear to auscultation, nonlabored breathing at rest. Cardiac: Regular rate and rhythm, no S3 or significant systolic murmur, no pericardial rub. Abdomen: Soft, nontender, bowel sounds present. Extremities: No pitting edema, distal pulses 2+. Skin: Warm and dry. Musculoskeletal: No kyphosis. Neuropsychiatric: Alert and oriented x3, affect grossly appropriate.  ECG:  An ECG dated 06/28/2020 was personally reviewed today and demonstrated:  Normal sinus rhythm.  Recent Labwork: 06/29/2020: BUN 17; Creatinine, Ser 0.98; Hemoglobin 13.8; Magnesium 1.8; Platelets 209; Potassium 4.2; Sodium 136     Component Value Date/Time   CHOL 98 06/28/2020 1035   TRIG 101 06/28/2020 1035   HDL 29 (L) 06/28/2020 1035   CHOLHDL 3.4 06/28/2020 1035   VLDL 20 06/28/2020 1035   LDLCALC 49 06/28/2020 1035    Other Studies Reviewed Today:  Echocardiogram 06/28/2020: 1. Left ventricular ejection fraction, by estimation, is 55 to 60%. The  left ventricle has normal function. The left ventricle has no regional  wall motion abnormalities. There is mild left ventricular hypertrophy.  Left ventricular diastolic parameters  were normal.  2. Right ventricular systolic function is normal. The right ventricular  size is normal. Tricuspid regurgitation signal is inadequate for assessing  PA pressure.  3. The mitral valve is grossly normal. Trivial mitral valve  regurgitation.  4. The aortic valve is tricuspid. Aortic valve regurgitation is not  visualized.  5. The inferior vena cava is normal in size with greater than 50%  respiratory variability, suggesting right atrial pressure of 3 mmHg.   Lexiscan Myoview 07/09/2020:  There was no ST segment deviation noted during stress.  Findings consistent with prior inferionr/inferoapical myocardial infarction with mild peri-infarct ischemia.  This is a low risk study.  The  left ventricular ejection fraction is normal (55-65%).  Assessment and Plan:  1.  Accelerating angina with recent Lexiscan Myoview indicating inferior/inferoapical scar with mild peri-infarct ischemia, new diagnosis of potential ischemic heart disease.  No evidence of ACS at ER visit in late April by ECG or high-sensitivity troponin I levels.  We have discussed the risks and benefits of a diagnostic cardiac catheterization and he is in agreement to proceed.  Continue aspirin and lisinopril, add Toprol-XL 25 mg daily and Crestor 5 mg daily (recent LDL 49).  He also has nitroglycerin available.  2.  Essential hypertension, currently on lisinopril.  Adding low-dose beta-blocker as well.  3.  Type 2 diabetes mellitus, currently on Glucophage with follow-up by PCP.  Likely good candidate for SGLT2 inhibitor.  Medication Adjustments/Labs and Tests Ordered: Current medicines are reviewed at length with the patient today.  Concerns regarding medicines are outlined above.   Tests Ordered: No orders of the defined types were placed in this encounter.   Medication Changes: No  orders of the defined types were placed in this encounter.   Disposition:  Follow up after procedure.  Signed, Jonelle Sidle, MD, [facility] 07/16/2020 8:50 AM    [facility] at [facility] [address], [place], [place] [address] Phone: [phone]; Fax: [phone]

## 2020-07-16 ENCOUNTER — Other Ambulatory Visit: Payer: Self-pay

## 2020-07-16 ENCOUNTER — Encounter: Payer: Self-pay | Admitting: Cardiology

## 2020-07-16 ENCOUNTER — Other Ambulatory Visit: Payer: Self-pay | Admitting: Cardiology

## 2020-07-16 ENCOUNTER — Ambulatory Visit (INDEPENDENT_AMBULATORY_CARE_PROVIDER_SITE_OTHER): Payer: Medicare Other | Admitting: Cardiology

## 2020-07-16 VITALS — BP 148/78 | HR 96 | Ht 70.0 in | Wt 241.2 lb

## 2020-07-16 DIAGNOSIS — R9439 Abnormal result of other cardiovascular function study: Secondary | ICD-10-CM

## 2020-07-16 DIAGNOSIS — I200 Unstable angina: Secondary | ICD-10-CM | POA: Diagnosis not present

## 2020-07-16 DIAGNOSIS — I10 Essential (primary) hypertension: Secondary | ICD-10-CM | POA: Diagnosis not present

## 2020-07-16 DIAGNOSIS — I259 Chronic ischemic heart disease, unspecified: Secondary | ICD-10-CM

## 2020-07-16 DIAGNOSIS — E119 Type 2 diabetes mellitus without complications: Secondary | ICD-10-CM

## 2020-07-16 MED ORDER — ROSUVASTATIN CALCIUM 5 MG PO TABS: 5.0000 mg | ORAL_TABLET | Freq: Every day | ORAL | 3 refills | Status: DC

## 2020-07-16 MED ORDER — METOPROLOL SUCCINATE ER 25 MG PO TB24: 25.0000 mg | ORAL_TABLET | Freq: Every day | ORAL | 3 refills | Status: DC

## 2020-07-16 MED ORDER — SODIUM CHLORIDE 0.9% FLUSH: 3.0000 mL | Freq: Two times a day (BID) | INTRAVENOUS | Status: DC

## 2020-07-16 NOTE — Addendum Note (Signed)
Addended by: Marlyn Corporal A on: 07/16/2020 08:54 AM   Modules accepted: Orders

## 2020-07-16 NOTE — Patient Instructions (Addendum)
    [facility] [address] [place] [address] Dept: [phone] Loc: [phone]  DAUNE COLGATE  07/16/2020  You are scheduled for a Cardiac Catheterization on Thursday, May 19 with Dr. Lance Muss.  1. Please arrive at the [facility] (Main Entrance A) at [facility]: [address] [place], [place] [address] at 5:30 AM (This time is two hours before your procedure to ensure your preparation). Free valet parking service is available.   Special note: Every effort is made to have your procedure done on time. Please understand that emergencies sometimes delay scheduled procedures.  2. Diet: Do not eat solid foods after midnight.  The patient may have clear liquids until 5am upon the day of the procedure.  3. Labs: N/A done 06/29/20  4. Medication instructions in preparation for your procedure:   Contrast Allergy: No        Do not take Diabetes Med Glucophage (Metformin) on the day of the procedure and HOLD 48 HOURS AFTER THE PROCEDURE.  On the morning of your procedure, take your Aspirin and any morning medicines NOT listed above.  You may use sips of water.  5. Plan for one night stay--bring personal belongings. 6. Bring a current list of your medications and current insurance cards. 7. You MUST have a responsible person to drive you home. 8. Someone MUST be with you the first 24 hours after you arrive home or your discharge will be delayed. 9. Please wear clothes that are easy to get on and off and wear slip-on shoes.  Thank you for allowing [place] to care for you!   -- [facility]       START Toprol XL 25 mg daily  START Crestor 5 mg daily at dinner or bedtime

## 2020-07-22 ENCOUNTER — Other Ambulatory Visit: Payer: Self-pay

## 2020-07-22 DIAGNOSIS — I200 Unstable angina: Secondary | ICD-10-CM

## 2020-07-23 ENCOUNTER — Other Ambulatory Visit (HOSPITAL_COMMUNITY)
Admission: RE | Admit: 2020-07-23 | Discharge: 2020-07-23 | Disposition: A | Discharge status: Home / Self Care | Payer: Medicare Other | Source: Ambulatory Visit | Attending: Interventional Cardiology | Admitting: Interventional Cardiology

## 2020-07-23 DIAGNOSIS — Z01812 Encounter for preprocedural laboratory examination: Secondary | ICD-10-CM | POA: Insufficient documentation

## 2020-07-23 DIAGNOSIS — Z20822 Contact with and (suspected) exposure to covid-19: Secondary | ICD-10-CM | POA: Diagnosis not present

## 2020-07-24 ENCOUNTER — Telehealth: Payer: Self-pay | Admitting: *Deleted

## 2020-07-24 ENCOUNTER — Encounter (HOSPITAL_COMMUNITY): Payer: Medicare Other

## 2020-07-24 LAB — SARS CORONAVIRUS 2 (TAT 6-24 HRS): SARS Coronavirus 2: NEGATIVE

## 2020-07-24 NOTE — Telephone Encounter (Signed)
Pt contacted pre-catheterization scheduled at [facility] for: Thursday Jul 25, 2020 7:30 AM Verified arrival time and place: [facility] Main Entrance A [facility]) at: 5:30 AM   No solid food after midnight prior to cath, clear liquids until 5 AM day of procedure.  Hold: Metformin-day of procedure and 48 hours post procedure  Except hold AM meds can be  taken pre-cath with sips of water including: ASA 81 mg   Confirmed patient has responsible adult to drive home post procedure and be with patient first 24 hours after arriving home: yes  You are allowed ONE visitor in the waiting room during the time you are at the hospital for your procedure. Both you and your visitor must wear a mask once you enter the hospital.   Reviewed procedure/mask/visitor instructions with patient.

## 2020-07-25 ENCOUNTER — Encounter (HOSPITAL_COMMUNITY): Payer: Self-pay | Admitting: Cardiovascular Disease

## 2020-07-25 ENCOUNTER — Encounter (HOSPITAL_COMMUNITY)
Admission: RE | Disposition: A | Discharge status: Home / Self Care | Payer: Self-pay | Source: Home / Self Care | Attending: Cardiovascular Disease

## 2020-07-25 ENCOUNTER — Other Ambulatory Visit: Payer: Self-pay

## 2020-07-25 ENCOUNTER — Ambulatory Visit (HOSPITAL_COMMUNITY)
Admission: RE | Admit: 2020-07-25 | Discharge: 2020-07-25 | Disposition: A | Discharge status: Home / Self Care | Payer: Medicare Other | Attending: Cardiovascular Disease | Admitting: Cardiovascular Disease

## 2020-07-25 DIAGNOSIS — Z8249 Family history of ischemic heart disease and other diseases of the circulatory system: Secondary | ICD-10-CM | POA: Insufficient documentation

## 2020-07-25 DIAGNOSIS — I2510 Atherosclerotic heart disease of native coronary artery without angina pectoris: Secondary | ICD-10-CM

## 2020-07-25 DIAGNOSIS — E119 Type 2 diabetes mellitus without complications: Secondary | ICD-10-CM | POA: Insufficient documentation

## 2020-07-25 DIAGNOSIS — I25119 Atherosclerotic heart disease of native coronary artery with unspecified angina pectoris: Secondary | ICD-10-CM | POA: Insufficient documentation

## 2020-07-25 DIAGNOSIS — Z7982 Long term (current) use of aspirin: Secondary | ICD-10-CM | POA: Diagnosis not present

## 2020-07-25 DIAGNOSIS — Z79890 Hormone replacement therapy (postmenopausal): Secondary | ICD-10-CM | POA: Insufficient documentation

## 2020-07-25 DIAGNOSIS — R9439 Abnormal result of other cardiovascular function study: Secondary | ICD-10-CM | POA: Insufficient documentation

## 2020-07-25 DIAGNOSIS — Z79899 Other long term (current) drug therapy: Secondary | ICD-10-CM | POA: Insufficient documentation

## 2020-07-25 DIAGNOSIS — Z8719 Personal history of other diseases of the digestive system: Secondary | ICD-10-CM | POA: Insufficient documentation

## 2020-07-25 DIAGNOSIS — Z7984 Long term (current) use of oral hypoglycemic drugs: Secondary | ICD-10-CM | POA: Insufficient documentation

## 2020-07-25 DIAGNOSIS — R079 Chest pain, unspecified: Secondary | ICD-10-CM | POA: Diagnosis present

## 2020-07-25 DIAGNOSIS — I10 Essential (primary) hypertension: Secondary | ICD-10-CM | POA: Insufficient documentation

## 2020-07-25 DIAGNOSIS — E079 Disorder of thyroid, unspecified: Secondary | ICD-10-CM | POA: Diagnosis not present

## 2020-07-25 HISTORY — PX: LEFT HEART CATH AND CORONARY ANGIOGRAPHY: CATH118249

## 2020-07-25 HISTORY — PX: CORONARY PRESSURE/FFR STUDY: CATH118243

## 2020-07-25 LAB — POCT ACTIVATED CLOTTING TIME: Activated Clotting Time: 273 seconds

## 2020-07-25 LAB — GLUCOSE, CAPILLARY
Glucose-Capillary: 184 mg/dL — ABNORMAL HIGH (ref 70–99)
Glucose-Capillary: 163 mg/dL — ABNORMAL HIGH (ref 70–99)

## 2020-07-25 SURGERY — LEFT HEART CATH AND CORONARY ANGIOGRAPHY
Anesthesia: LOCAL

## 2020-07-25 MED ORDER — SODIUM CHLORIDE 0.9 % WEIGHT BASED INFUSION
1.0000 mL/kg/h | INTRAVENOUS | Status: DC
Start: 2020-07-25 — End: 2020-07-25

## 2020-07-25 MED ORDER — SODIUM CHLORIDE 0.9% FLUSH: 3.0000 mL | INTRAVENOUS | Status: DC | PRN

## 2020-07-25 MED ORDER — IOHEXOL 350 MG/ML SOLN
INTRAVENOUS | Status: DC | PRN
  Administered 2020-07-25: 100 mL

## 2020-07-25 MED ORDER — SODIUM CHLORIDE 0.9% FLUSH: 3.0000 mL | Freq: Two times a day (BID) | INTRAVENOUS | Status: DC

## 2020-07-25 MED ORDER — VERAPAMIL HCL 2.5 MG/ML IV SOLN
INTRA_ARTERIAL | Status: DC | PRN
  Administered 2020-07-25: 5 mL via INTRA_ARTERIAL
  Administered 2020-07-25: 10 mL via INTRA_ARTERIAL

## 2020-07-25 MED ORDER — ONDANSETRON HCL 4 MG/2ML IJ SOLN: 4.0000 mg | Freq: Four times a day (QID) | INTRAMUSCULAR | Status: DC | PRN

## 2020-07-25 MED ORDER — ASPIRIN 81 MG PO CHEW
81.0000 mg | CHEWABLE_TABLET | Freq: Every day | ORAL | Status: DC
  Administered 2020-07-25: 81 mg via ORAL
  Filled 2020-07-25: qty 1

## 2020-07-25 MED ORDER — ASPIRIN 81 MG PO CHEW: 81.0000 mg | CHEWABLE_TABLET | ORAL | Status: AC

## 2020-07-25 MED ORDER — HYDRALAZINE HCL 20 MG/ML IJ SOLN: 10.0000 mg | INTRAMUSCULAR | Status: DC | PRN

## 2020-07-25 MED ORDER — SODIUM CHLORIDE 0.9 % IV SOLN
250.0000 mL | INTRAVENOUS | Status: DC | PRN
Start: 2020-07-25 — End: 2020-07-25

## 2020-07-25 MED ORDER — SODIUM CHLORIDE 0.9 % WEIGHT BASED INFUSION: 3.0000 mL/kg/h | INTRAVENOUS | Status: AC

## 2020-07-25 MED ORDER — FENTANYL CITRATE (PF) 100 MCG/2ML IJ SOLN
INTRAMUSCULAR | Status: DC | PRN
  Administered 2020-07-25: 25 ug via INTRAVENOUS

## 2020-07-25 MED ORDER — HEPARIN (PORCINE) IN NACL 1000-0.9 UT/500ML-% IV SOLN
INTRAVENOUS | Status: DC | PRN
  Administered 2020-07-25 (×2): 500 mL

## 2020-07-25 MED ORDER — LIDOCAINE HCL (PF) 1 % IJ SOLN
INTRAMUSCULAR | Status: AC
  Filled 2020-07-25: qty 30

## 2020-07-25 MED ORDER — HEPARIN SODIUM (PORCINE) 1000 UNIT/ML IJ SOLN
INTRAMUSCULAR | Status: AC
  Filled 2020-07-25: qty 1

## 2020-07-25 MED ORDER — ACETAMINOPHEN 325 MG PO TABS: 650.0000 mg | ORAL_TABLET | ORAL | Status: DC | PRN

## 2020-07-25 MED ORDER — LIDOCAINE HCL (PF) 1 % IJ SOLN
INTRAMUSCULAR | Status: DC | PRN
  Administered 2020-07-25: 2 mL

## 2020-07-25 MED ORDER — MORPHINE SULFATE (PF) 2 MG/ML IV SOLN
2.0000 mg | INTRAVENOUS | Status: DC | PRN
Start: 2020-07-25 — End: 2020-07-25

## 2020-07-25 MED ORDER — VERAPAMIL HCL 2.5 MG/ML IV SOLN
INTRAVENOUS | Status: AC
  Filled 2020-07-25: qty 2

## 2020-07-25 MED ORDER — HEPARIN SODIUM (PORCINE) 1000 UNIT/ML IJ SOLN
INTRAMUSCULAR | Status: DC | PRN
  Administered 2020-07-25 (×2): 5000 [IU] via INTRAVENOUS

## 2020-07-25 MED ORDER — LABETALOL HCL 5 MG/ML IV SOLN: 10.0000 mg | INTRAVENOUS | Status: DC | PRN

## 2020-07-25 MED ORDER — FENTANYL CITRATE (PF) 100 MCG/2ML IJ SOLN
INTRAMUSCULAR | Status: AC
  Filled 2020-07-25: qty 2

## 2020-07-25 MED ORDER — HEPARIN (PORCINE) IN NACL 1000-0.9 UT/500ML-% IV SOLN
INTRAVENOUS | Status: AC
  Filled 2020-07-25: qty 1000

## 2020-07-25 MED ORDER — MIDAZOLAM HCL 2 MG/2ML IJ SOLN
INTRAMUSCULAR | Status: DC | PRN
  Administered 2020-07-25: 1 mg via INTRAVENOUS

## 2020-07-25 MED ORDER — AMLODIPINE BESYLATE 5 MG PO TABS: 2.5000 mg | ORAL_TABLET | Freq: Every day | ORAL | 2 refills | Status: DC

## 2020-07-25 MED ORDER — MIDAZOLAM HCL 2 MG/2ML IJ SOLN
INTRAMUSCULAR | Status: AC
  Filled 2020-07-25: qty 2

## 2020-07-25 MED ORDER — SODIUM CHLORIDE 0.9 % IV SOLN: INTRAVENOUS | Status: DC

## 2020-07-25 MED ORDER — NITROGLYCERIN 1 MG/10 ML FOR IR/CATH LAB
INTRA_ARTERIAL | Status: AC
  Filled 2020-07-25: qty 10

## 2020-07-25 MED ORDER — SODIUM CHLORIDE 0.9 % IV SOLN: 250.0000 mL | INTRAVENOUS | Status: DC | PRN

## 2020-07-25 SURGICAL SUPPLY — 13 items
CATH INFINITI 5FR ANG PIGTAIL (CATHETERS) ×2 IMPLANT
CATH OPTITORQUE TIG 4.0 5F (CATHETERS) ×2 IMPLANT
CATH VISTA GUIDE 6FR XBLAD3.5 (CATHETERS) ×2 IMPLANT
DEVICE RAD COMP TR BAND LRG (VASCULAR PRODUCTS) ×2 IMPLANT
GLIDESHEATH SLEND A-KIT 6F 22G (SHEATH) ×2 IMPLANT
GUIDEWIRE INQWIRE 1.5J.035X260 (WIRE) ×1 IMPLANT
GUIDEWIRE PRESSURE X 175 (WIRE) ×2 IMPLANT
INQWIRE 1.5J .035X260CM (WIRE) ×2
KIT HEART LEFT (KITS) ×2 IMPLANT
PACK CARDIAC CATHETERIZATION (CUSTOM PROCEDURE TRAY) ×2 IMPLANT
TRANSDUCER W/STOPCOCK (MISCELLANEOUS) ×2 IMPLANT
TUBING CIL FLEX 10 FLL-RA (TUBING) ×4 IMPLANT
WIRE HI TORQ VERSACORE-J 145CM (WIRE) ×2 IMPLANT

## 2020-07-25 NOTE — Discharge Instructions (Signed)
Radial Site Care  This sheet gives you information about how to care for yourself after your procedure. Your health care provider may also give you more specific instructions. If you have problems or questions, contact your health care provider. What can I expect after the procedure? After the procedure, it is common to have:  Bruising and tenderness at the catheter insertion area. Follow these instructions at home: Medicines  Take over-the-counter and prescription medicines only as told by your health care provider. Insertion site care  Follow instructions from your health care provider about how to take care of your insertion site. Make sure you: ? Wash your hands with soap and water before you change your bandage (dressing). If soap and water are not available, use hand sanitizer. ? Change your dressing as told by your health care provider. ? Leave stitches (sutures), skin glue, or adhesive strips in place. These skin closures may need to stay in place for 2 weeks or longer. If adhesive strip edges start to loosen and curl up, you may trim the loose edges. Do not remove adhesive strips completely unless your health care provider tells you to do that.  Check your insertion site every day for signs of infection. Check for: ? Redness, swelling, or pain. ? Fluid or blood. ? Pus or a bad smell. ? Warmth.  Do not take baths, swim, or use a hot tub until your health care provider approves.  You may shower 24-48 hours after the procedure, or as directed by your health care provider. ? Remove the dressing and gently wash the site with plain soap and water. ? Pat the area dry with a clean towel. ? Do not rub the site. That could cause bleeding.  Do not apply powder or lotion to the site. Activity  For 24 hours after the procedure, or as directed by your health care provider: ? Do not flex or bend the affected arm. ? Do not push or pull heavy objects with the affected arm. ? Do not drive  yourself home from the hospital or clinic. You may drive 24 hours after the procedure unless your health care provider tells you not to. ? Do not operate machinery or power tools.  Do not lift anything that is heavier than 10 lb (4.5 kg), or the limit that you are told, until your health care provider says that it is safe.  Ask your health care provider when it is okay to: ? Return to work or school. ? Resume usual physical activities or sports. ? Resume sexual activity.   General instructions  If the catheter site starts to bleed, raise your arm and put firm pressure on the site. If the bleeding does not stop, get help right away. This is a medical emergency.  If you went home on the same day as your procedure, a responsible adult should be with you for the first 24 hours after you arrive home.  Keep all follow-up visits as told by your health care provider. This is important. Contact a health care provider if:  You have a fever.  You have redness, swelling, or yellow drainage around your insertion site. Get help right away if:  You have unusual pain at the radial site.  The catheter insertion area swells very fast.  The insertion area is bleeding, and the bleeding does not stop when you hold steady pressure on the area.  Your arm or hand becomes pale, cool, tingly, or numb. These symptoms may represent a serious   problem that is an emergency. Do not wait to see if the symptoms will go away. Get medical help right away. Call your local emergency services (911 in the [place] Do not drive yourself to the hospital. Summary  After the procedure, it is common to have bruising and tenderness at the site.  Follow instructions from your health care provider about how to take care of your radial site wound. Check the wound every day for signs of infection.  Do not lift anything that is heavier than 10 lb (4.5 kg), or the limit that you are told, until your health care provider says that it  is safe. This information is not intended to replace advice given to you by your health care provider. Make sure you discuss any questions you have with your health care provider. Document Revised: 03/31/2017 Document Reviewed: 03/31/2017 Elsevier Patient Education  2021 [employer]vier Inc[employer]  

## 2020-07-25 NOTE — Progress Notes (Signed)
Discharge instruction given per MD order.  Pt and daughter abale to verbalize understanding

## 2020-07-25 NOTE — Interval H&P Note (Signed)
Cath Lab Visit (complete for each Cath Lab visit)  Clinical Evaluation Leading to the Procedure:   ACS: Yes.    Non-ACS:    Anginal Classification: CCS II  Anti-ischemic medical therapy: Minimal Therapy (1 class of medications)  Non-Invasive Test Results: Low-risk stress test findings: cardiac mortality <1%/year  Prior CABG: No previous CABG      History and Physical Interval Note:  07/25/2020 7:37 AM  Bradley Acevedo  has presented today for surgery, with the diagnosis of abnormal nuc - angina.  The various methods of treatment have been discussed with the patient and family. After consideration of risks, benefits and other options for treatment, the patient has consented to  Procedure(s): LEFT HEART CATH AND CORONARY ANGIOGRAPHY (N/A) as a surgical intervention.  The patient's history has been reviewed, patient examined, no change in status, stable for surgery.  I have reviewed the patient's chart and labs.  Questions were answered to the patient's satisfaction.     Bradley Acevedo

## 2020-08-06 NOTE — Progress Notes (Signed)
Cardiology Office Note    Date:  08/19/2020   ID:  [name], DOB 1945-05-27, MRN [id number]   PCP:  Bradley Acevedo   [facility]  Cardiologist:  Nona Dell, Acevedo  Advanced Practice Provider:  No care team member to display Electrophysiologist:  None   [id number]   No chief complaint on file.   History of Present Illness:  Bradley Acevedo is a 75 y.o. male hospitalized with chest pain 06/2020 troponins negative EKG without acute change, echo LVEF 55 to 60% without wall motion abnormalities.  Lexiscan Myoview showed evidence of inferior/inferior apical infarct scar mild peri-infarct ischemia.  Dr. Diona Acevedo recommend cardiac catheterization because of recurrent chest pain at low levels of activity.  Cardiac cath 07/25/20 showed a long proximal to mid LAD 50% stenosis that was long and borderline significant RFR.  Antianginals were recommended with intervention only for recalcitrant symptoms.  Patient comes in for f/u. Chest pressure much better but still gets some occasionally when he gets stressed out. Not with activity. Very active in am.     Past Medical History:  Diagnosis Date   Acid reflux    Borderline diabetes    Diabetes mellitus without complication (HCC)    Diverticulosis    Hypertension    Hypothyroid    IBS (irritable bowel syndrome)    Thyroid disease     Past Surgical History:  Procedure Laterality Date   CIRCUMCISION     COLONOSCOPY     COLONOSCOPY  06/25/2016   [place], [place] by Bradley Acevedo   INTRAVASCULAR PRESSURE WIRE/FFR STUDY N/A 07/25/2020   Procedure: INTRAVASCULAR PRESSURE WIRE/FFR STUDY;  Surgeon: Bradley Acevedo;  [facility];  Service: Cardiovascular;  Laterality: N/A;   LEFT HEART CATH AND CORONARY ANGIOGRAPHY N/A 07/25/2020   Procedure: LEFT HEART CATH AND CORONARY ANGIOGRAPHY;  Surgeon: Bradley Acevedo;  [facility];  Service: Cardiovascular;  Laterality: N/A;    meniscus left knee     MENISCUS REPAIR Left 05/28/2017   [place], [place] by Dr. Clovis Acevedo   TONSILLECTOMY  1953   [place], [place]    Current Medications: No outpatient medications have been marked as taking for the 08/19/20 encounter (Office Visit) with Bradley Kief, PA-C.   Current Facility-Administered Medications for the 08/19/20 encounter (Office Visit) with Bradley Kief, PA-C  Medication   sodium chloride flush (NS) 0.9 % injection 3 mL     Allergies:   Patient has no known allergies.   Social History   Socioeconomic History   Marital status: Widowed    Spouse name: Not on file   Number of children: Not on file   Years of education: Not on file   Highest education level: Not on file  Occupational History   Not on file  Tobacco Use   Smoking status: Never   Smokeless tobacco: Never  Vaping Use   Vaping Use: Never used  Substance and Sexual Activity   Alcohol use: Not Currently   Drug use: Not Currently   Sexual activity: Not on file  Other Topics Concern   Not on file  Social History Narrative   Not on file   Social Determinants of Health   Financial Resource Strain: Not on file  Food Insecurity: Not on file  Transportation Needs: Not on file  Physical Activity: Not on file  Stress: Not on file  Social Connections: Not on file  Family History:  The patient's family history includes Hypertension in his father and mother.   ROS:   Please see the history of present illness.    ROS All other systems reviewed and are negative.   PHYSICAL EXAM:   VS:  BP 126/64   Pulse 68   Wt 240 lb (108.9 kg)   SpO2 98%   BMI 34.44 kg/m   Physical Exam  GEN: Obese, in no acute distress  Neck: no JVD, carotid bruits, or masses Cardiac:RRR; no murmurs, rubs, or gallops  Respiratory:  clear to auscultation bilaterally, normal work of breathing GI: soft, nontender, nondistended, + BS Ext: Right arm at cath site without hematoma or hemorrhage, lower extremities  without cyanosis, clubbing, or edema, Good distal pulses bilaterally Neuro:  Alert and Oriented x 3 Psych: euthymic mood, full affect  Wt Readings from Last 3 Encounters:  08/19/20 240 lb (108.9 kg)  07/25/20 240 lb (108.9 kg)  07/16/20 241 lb 3.2 oz (109.4 kg)      Studies/Labs Reviewed:   EKG:  EKG is not ordered today.     Recent Labs: 06/29/2020: BUN 17; Creatinine, Ser 0.98; Hemoglobin 13.8; Magnesium 1.8; Platelets 209; Potassium 4.2; Sodium 136   Lipid Panel    Component Value Date/Time   CHOL 98 06/28/2020 1035   TRIG 101 06/28/2020 1035   HDL 29 (L) 06/28/2020 1035   CHOLHDL 3.4 06/28/2020 1035   VLDL 20 06/28/2020 1035   LDLCALC 49 06/28/2020 1035    Additional studies/ records that were reviewed today include:  Cardiac cath 07/25/20 IMPRESSION: Mr. Qu has an intermediate fairly long proximal LAD lesion with a borderline significant RFR on minimal antianginals.  He required a fairly long stent and a relatively smallish caliber LAD beginning near ostial.  I recommend optimizing antianginal medications and recommend intervention for recalcitrant symptoms.  The sheath was removed and a TR band was placed on the right wrist to achieve patent hemostasis.  The patient left lab in stable condition.  These recommendations were communicated with his attending cardiologist, Dr. Simona Huh.   [name]. Acevedo, [facility] 07/25/2020 8:50 AM   Prox LAD to Mid LAD lesion is 50% stenosed.   NST 07/09/20 There was no ST segment deviation noted during stress. Findings consistent with prior inferionr/inferoapical myocardial infarction with mild peri-infarct ischemia. This is a low risk study. The left ventricular ejection fraction is normal (55-65%).    Echo 06/28/20 IMPRESSIONS     1. Left ventricular ejection fraction, by estimation, is 55 to 60%. The  left ventricle has normal function. The left ventricle has no regional  wall motion abnormalities. There is mild left  ventricular hypertrophy.  Left ventricular diastolic parameters  were normal.   2. Right ventricular systolic function is normal. The right ventricular  size is normal. Tricuspid regurgitation signal is inadequate for assessing  PA pressure.   3. The mitral valve is grossly normal. Trivial mitral valve  regurgitation.   4. The aortic valve is tricuspid. Aortic valve regurgitation is not  visualized.   5. The inferior vena cava is normal in size with greater than 50%  respiratory variability, suggesting right atrial pressure of 3 mmHg.    Risk Assessment/Calculations:         ASSESSMENT:    1. Coronary artery disease involving native coronary artery of native heart without angina pectoris   2. Essential hypertension   3. Type 2 diabetes mellitus without complication, without long-term current use of insulin (HCC)  4. Hyperlipidemia, unspecified hyperlipidemia type      PLAN:  In order of problems listed above:  Chest pain with nonobstructive CAD on cath  50% proximal to mid LAD with borderline significant RFR.  Plan for increase antianginals and intervention only for recalcitrant symptoms.  Patient says chest pressure is much better and no longer having with activity.  He has been on occasion when he gets stressed out but only lasts a few minutes and has not required nitroglycerin.  HTN blood pressure well controlled  DM2 A1c 6.8 06/2020  Hyperlipidemia LDL 49 06/2020  Shared Decision Making/Informed Consent        Medication Adjustments/Labs and Tests Ordered: Current medicines are reviewed at length with the patient today.  Concerns regarding medicines are outlined above.  Medication changes, Labs and Tests ordered today are listed in the Patient Instructions below. Patient Instructions  Medication Instructions:  Your physician recommends that you continue on your current medications as directed. Please refer to the Current Medication list given to you today.  *If  you need a refill on your cardiac medications before your next appointment, please call your pharmacy*   Lab Work: None today  If you have labs (blood work) drawn today and your tests are completely normal, you will receive your results only by: MyChart Message (if you have MyChart) OR A paper copy in the mail If you have any lab test that is abnormal or we need to change your treatment, we will call you to review the results.   Testing/Procedures: None today    Follow-Up: At [facility], you and your health needs are our priority.  As part of our continuing mission to provide you with exceptional heart care, we have created designated Provider Care Teams.  These Care Teams include your primary Cardiologist (physician) and Advanced Practice Providers (APPs -  Physician Assistants and Nurse Practitioners) who all work together to provide you with the care you need, when you need it.  We recommend signing up for the patient portal called "MyChart".  Sign up information is provided on this After Visit Summary.  MyChart is used to connect with patients for Virtual Visits (Telemedicine).  Patients are able to view lab/test results, encounter notes, upcoming appointments, etc.  Non-urgent messages can be sent to your provider as well.   To learn more about what you can do with MyChart, go to [email or web address].    Your next appointment:   6 month(s)  The format for your next appointment:   In Person  Provider:   Nona Dell, Acevedo   Other Instructions None    Signed, Jacolyn Reedy, PA-C  08/19/2020 12:42 PM    [facility] [address] [place], [place], [place]  [address] Phone: [phone]; Fax: [phone]

## 2020-08-19 ENCOUNTER — Encounter: Payer: Self-pay | Admitting: Physician Assistant

## 2020-08-19 ENCOUNTER — Other Ambulatory Visit: Payer: Self-pay

## 2020-08-19 ENCOUNTER — Ambulatory Visit (INDEPENDENT_AMBULATORY_CARE_PROVIDER_SITE_OTHER): Payer: Medicare Other | Admitting: Physician Assistant

## 2020-08-19 VITALS — BP 126/64 | HR 68 | Wt 240.0 lb

## 2020-08-19 DIAGNOSIS — E119 Type 2 diabetes mellitus without complications: Secondary | ICD-10-CM | POA: Diagnosis not present

## 2020-08-19 DIAGNOSIS — I259 Chronic ischemic heart disease, unspecified: Secondary | ICD-10-CM | POA: Diagnosis not present

## 2020-08-19 DIAGNOSIS — E785 Hyperlipidemia, unspecified: Secondary | ICD-10-CM

## 2020-08-19 DIAGNOSIS — I10 Essential (primary) hypertension: Secondary | ICD-10-CM | POA: Diagnosis not present

## 2020-08-19 DIAGNOSIS — I2510 Atherosclerotic heart disease of native coronary artery without angina pectoris: Secondary | ICD-10-CM | POA: Diagnosis not present

## 2020-08-19 MED ORDER — AMLODIPINE BESYLATE 5 MG PO TABS: 2.5000 mg | ORAL_TABLET | Freq: Every day | ORAL | 3 refills | Status: DC

## 2020-08-19 MED ORDER — METOPROLOL SUCCINATE ER 25 MG PO TB24: 25.0000 mg | ORAL_TABLET | Freq: Every morning | ORAL | 3 refills | Status: DC

## 2020-08-19 MED ORDER — LISINOPRIL 10 MG PO TABS: 10.0000 mg | ORAL_TABLET | Freq: Every morning | ORAL | 3 refills | Status: DC

## 2020-08-19 NOTE — Patient Instructions (Signed)

## 2020-09-04 ENCOUNTER — Ambulatory Visit: Payer: 59 | Admitting: Cardiology

## 2020-09-05 LAB — HM DIABETES EYE EXAM

## 2020-09-20 ENCOUNTER — Telehealth: Payer: Self-pay | Admitting: Cardiology

## 2020-09-20 NOTE — Telephone Encounter (Signed)
Needs refill for   pantoprazole (PROTONIX) 40 MG tablet [[id number]]  ENDED sent to [facility] w/ Dr. Ival Bible name on the Rx

## 2020-09-20 NOTE — Telephone Encounter (Signed)
Spoke to pt and verbalized that pt's PCP should refill this medication. No mention of Pantoprazole in Dr. [name] or Judie Petit. Lenze's notes.

## 2020-10-01 ENCOUNTER — Telehealth: Payer: Self-pay | Admitting: Cardiology

## 2020-10-01 NOTE — Telephone Encounter (Signed)
Pt stated that his chest pain/pressure comes and goes. Over time the pressure seems to be worsening. Pt denies dizziness/nausea. Pt c/o- sob(comes and goes), fatigue. Pt stated that he has not taken SL NTGL but should have one day when the pain was too much. Pt is aware that if the symptoms worsen and persist, that emergent ED evaluation is the best way to be checked out. Pt verbalized understanding.

## 2020-10-01 NOTE — Telephone Encounter (Signed)
Pt c/o of Chest Pain: 1. Are you having CP right now? NO 2. Are you experiencing any other symptoms (ex. SOB, nausea, vomiting, sweating)? SOB 3. How long have you been experiencing CP? 2  Weeks off and on  4. Is your CP continuous or coming and going? Comes and goes  5. Have you taken Nitroglycerin? No  Went out walking 10/03/2020 starting having chest pains, shortness of breath  with weakness. Went into the house and things started feeling better.

## 2020-10-02 MED ORDER — METOPROLOL SUCCINATE ER 50 MG PO TB24: 50.0000 mg | ORAL_TABLET | Freq: Every day | ORAL | 3 refills | Status: DC

## 2020-10-02 NOTE — Telephone Encounter (Signed)
Pt notified and verbalized understanding.

## 2020-10-02 NOTE — Telephone Encounter (Signed)
Spoke to pt who said that he doesn't have bp/hr readings. He checked his vitals while on the phone and BP: 148/83, HR: 84bpm I advised pt to check his bp/hr daily going forward.

## 2020-10-19 ENCOUNTER — Other Ambulatory Visit: Payer: Self-pay | Admitting: Student

## 2020-12-24 ENCOUNTER — Telehealth: Payer: Self-pay | Admitting: Cardiology

## 2020-12-24 MED ORDER — METOPROLOL SUCCINATE ER 50 MG PO TB24: 50.0000 mg | ORAL_TABLET | Freq: Every day | ORAL | 3 refills | Status: DC

## 2020-12-24 NOTE — Telephone Encounter (Signed)
Completed.

## 2020-12-24 NOTE — Telephone Encounter (Signed)
New message     Switching pharmacys  *STAT* If patient is at the pharmacy, call can be transferred to refill team.   1. Which medications need to be refilled? (please list name of each medication and dose if known) metoprolol succinate (TOPROL XL) 50 MG 24 hr tablet  2. Which pharmacy/location (including street and city if local pharmacy) is medication to be sent to? [facility]   3. Do they need a 30 day or 90 day supply? 90

## 2021-02-21 ENCOUNTER — Other Ambulatory Visit: Payer: Self-pay

## 2021-02-21 ENCOUNTER — Encounter: Payer: Self-pay | Admitting: Cardiology

## 2021-02-21 ENCOUNTER — Ambulatory Visit (INDEPENDENT_AMBULATORY_CARE_PROVIDER_SITE_OTHER): Payer: Medicare Other | Admitting: Cardiology

## 2021-02-21 VITALS — BP 120/58 | HR 76 | Ht 70.0 in | Wt 241.0 lb

## 2021-02-21 DIAGNOSIS — I25119 Atherosclerotic heart disease of native coronary artery with unspecified angina pectoris: Secondary | ICD-10-CM | POA: Diagnosis not present

## 2021-02-21 DIAGNOSIS — I259 Chronic ischemic heart disease, unspecified: Secondary | ICD-10-CM | POA: Diagnosis not present

## 2021-02-21 DIAGNOSIS — E782 Mixed hyperlipidemia: Secondary | ICD-10-CM | POA: Diagnosis not present

## 2021-02-21 NOTE — Progress Notes (Signed)
Cardiology Office Note  Date: 02/21/2021   ID: Bradley Acevedo, DOB 1945/05/20, MRN [id number]  PCP:  Shawnie Dapper, PA-C  Cardiologist:  Nona Dell, MD Electrophysiologist:  None   Chief Complaint  Patient presents with   Cardiac follow-up     History of Present Illness: Bradley Acevedo is a 75 y.o. male last seen in June by Ms. Bradley Bers PA-C.  He is here for a routine visit.  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  I reviewed his current cardiac regimen which is noted below.  He does not report any obvious intolerances.  Blood pressure is well controlled today.  He states that he has been updated on vaccines for influenza, COVID-19 booster, and Tdap.  He goes to the [facility] clinic, also continues to see [place].  His most recent LDL was 49.  Past Medical History:  Diagnosis Date   Acid reflux    CAD (coronary artery disease)    Moderate LAD disease May 2022 managed medically   Diverticulosis    Hypertension    Hypothyroid    IBS (irritable bowel syndrome)    Type 2 diabetes mellitus [facility])     Past Surgical History:  Procedure Laterality Date   CIRCUMCISION     COLONOSCOPY     COLONOSCOPY  06/25/2016   [place], [place] by [name]   INTRAVASCULAR PRESSURE WIRE/FFR STUDY N/A 07/25/2020   Procedure: INTRAVASCULAR PRESSURE WIRE/FFR STUDY;  Surgeon: [name], MD;  [facility];  Service: Cardiovascular;  Laterality: N/A;   LEFT HEART CATH AND CORONARY ANGIOGRAPHY N/A 07/25/2020   Procedure: LEFT HEART CATH AND CORONARY ANGIOGRAPHY;  Surgeon: [name], MD;  Location: [facility];  Service: Cardiovascular;  Laterality: N/A;   meniscus left knee     MENISCUS REPAIR Left 05/28/2017   [place], [place] by Dr. [name]   TONSILLECTOMY  1953   [place], [place]    Current Outpatient Medications   Medication Sig Dispense Refill   amLODipine (NORVASC) 5 MG tablet TAKE 1/2 TABLET BY MOUTH DAILY 45 tablet 3   aspirin EC 81 MG EC tablet Take 1 tablet (81 mg total) by mouth daily. Swallow whole. (Patient taking differently: Take 81 mg by mouth in the morning. Swallow whole.) 30 tablet 11   Cholecalciferol (D3 DOTS) 50 MCG (2000 UT) TBDP Take 2,000 Units by mouth in the morning.     hyoscyamine (ANASPAZ) 0.125 MG TBDP disintergrating tablet Take 0.125-0.25 mg by mouth every 4 (four) hours as needed for bladder spasms or cramping.     levothyroxine (SYNTHROID) 50 MCG tablet Take 50 mcg by mouth daily before breakfast.     lisinopril (ZESTRIL) 10 MG tablet Take 1 tablet (10 mg total) by mouth in the morning. 90 tablet 3   metFORMIN (GLUCOPHAGE) 500 MG tablet Take 500 mg by mouth in the morning.     metoprolol succinate (TOPROL XL) 50 MG 24 hr tablet Take 1 tablet (50 mg total) by mouth daily. Take with or immediately following a meal. 90 tablet 3   nitroGLYCERIN (NITROSTAT) 0.4 MG SL tablet Place 1 tablet (0.4 mg total) under the tongue every 5 (five) minutes as needed for chest pain. (Patient taking differently: Place 0.4 mg under the tongue every 5 (five) minutes x 3 doses as needed for chest pain.) 30 tablet 12  ondansetron (ZOFRAN-ODT) 4 MG disintegrating tablet Take 4 mg by mouth every 8 (eight) hours as needed for nausea or vomiting.     pantoprazole (PROTONIX) 40 MG tablet Take 1 tablet (40 mg total) by mouth daily. (Patient taking differently: Take 40 mg by mouth in the morning.) 30 tablet 2   Probiotic Product (PROBIOTIC PO) Take 1 capsule by mouth in the morning.     rosuvastatin (CRESTOR) 5 MG tablet Take 1 tablet (5 mg total) by mouth daily. (Patient taking differently: Take 5 mg by mouth daily after lunch.) 90 tablet 3   Current Facility-Administered Medications  Medication Dose Route Frequency Provider Last Rate Last Admin   sodium chloride flush (NS) 0.9 % injection 3 mL  3 mL  Intravenous Q12H Jonelle Sidle, MD       Allergies:  Patient has no known allergies.   ROS: No syncope.  Physical Exam: VS:  BP (!) 120/58    Pulse 76    Ht 5\' 10"  (1.778 m)    Wt 241 lb (109.3 kg)    SpO2 99%    BMI 34.58 kg/m , BMI Body mass index is 34.58 kg/m.  Wt Readings from Last 3 Encounters:  02/21/21 241 lb (109.3 kg)  08/19/20 240 lb (108.9 kg)  07/25/20 240 lb (108.9 kg)    General: Patient appears comfortable at rest. HEENT: Conjunctiva and lids normal, wearing a mask. Neck: Supple, no elevated JVP or carotid bruits, no thyromegaly. Lungs: Clear to auscultation, nonlabored breathing at rest. Cardiac: Regular rate and rhythm, no S3 or significant systolic murmur, no pericardial rub. Extremities: No pitting edema.  ECG:  An ECG dated 07/25/2020 was personally reviewed today and demonstrated:  Sinus rhythm.  Recent Labwork: 06/29/2020: BUN 17; Creatinine, Ser 0.98; Hemoglobin 13.8; Magnesium 1.8; Platelets 209; Potassium 4.2; Sodium 136     Component Value Date/Time   CHOL 98 06/28/2020 1035   TRIG 101 06/28/2020 1035   HDL 29 (L) 06/28/2020 1035   CHOLHDL 3.4 06/28/2020 1035   VLDL 20 06/28/2020 1035   LDLCALC 49 06/28/2020 1035    Other Studies Reviewed Today:  Echocardiogram 06/28/2020:  1. Left ventricular ejection fraction, by estimation, is 55 to 60%. The  left ventricle has normal function. The left ventricle has no regional  wall motion abnormalities. There is mild left ventricular hypertrophy.  Left ventricular diastolic parameters  were normal.   2. Right ventricular systolic function is normal. The right ventricular  size is normal. Tricuspid regurgitation signal is inadequate for assessing  PA pressure.   3. The mitral valve is grossly normal. Trivial mitral valve  regurgitation.   4. The aortic valve is tricuspid. Aortic valve regurgitation is not  visualized.   5. The inferior vena cava is normal in size with greater than 50%  respiratory  variability, suggesting right atrial pressure of 3 mmHg.   Cardiac catheterization 07/25/2020: Prox LAD to Mid LAD lesion is 50% stenosed.  IMPRESSION: [name] has an intermediate fairly long proximal LAD lesion with a borderline significant RFR on minimal antianginals.  He required a fairly long stent and a relatively smallish caliber LAD beginning near ostial.  I recommend optimizing antianginal medications and recommend intervention for recalcitrant symptoms.  Assessment and Plan:  1.  CAD, LAD disease as discussed above with plan for medical therapy in the absence of accelerating angina symptoms.  He is doing well at this time on aspirin, Norvasc, lisinopril, Toprol-XL, and Crestor.  2.  Mixed hyperlipidemia, on  Crestor with LDL 49.  Medication Adjustments/Labs and Tests Ordered: Current medicines are reviewed at length with the patient today.  Concerns regarding medicines are outlined above.   Tests Ordered: No orders of the defined types were placed in this encounter.   Medication Changes: No orders of the defined types were placed in this encounter.   Disposition:  Follow up  6 months.  Signed, Jonelle Sidle, MD, [facility] 02/21/2021 2:14 PM    [facility] [address] [place], [place] [address] Phone: [phone]; Fax: [phone]

## 2021-02-21 NOTE — Patient Instructions (Signed)
Medication Instructions:  Your physician recommends that you continue on your current medications as directed. Please refer to the Current Medication list given to you today.   Labwork: None  Testing/Procedures: None  Follow-Up: 6 months  Any Other Special Instructions Will Be Listed Below (If Applicable).     If you need a refill on your cardiac medications before your next appointment, please call your pharmacy.   

## 2021-02-26 ENCOUNTER — Encounter (HOSPITAL_COMMUNITY): Payer: Self-pay

## 2021-02-26 ENCOUNTER — Other Ambulatory Visit: Payer: Self-pay

## 2021-02-26 ENCOUNTER — Emergency Department (HOSPITAL_COMMUNITY)
Admission: EM | Admit: 2021-02-26 | Discharge: 2021-02-26 | Disposition: A | Discharge status: Home / Self Care | Payer: Medicare Other | Attending: Emergency Medicine | Admitting: Emergency Medicine

## 2021-02-26 ENCOUNTER — Emergency Department (HOSPITAL_COMMUNITY): Payer: Medicare Other

## 2021-02-26 DIAGNOSIS — R748 Abnormal levels of other serum enzymes: Secondary | ICD-10-CM | POA: Insufficient documentation

## 2021-02-26 DIAGNOSIS — Z7982 Long term (current) use of aspirin: Secondary | ICD-10-CM | POA: Diagnosis not present

## 2021-02-26 DIAGNOSIS — Z79899 Other long term (current) drug therapy: Secondary | ICD-10-CM | POA: Insufficient documentation

## 2021-02-26 DIAGNOSIS — I2510 Atherosclerotic heart disease of native coronary artery without angina pectoris: Secondary | ICD-10-CM | POA: Insufficient documentation

## 2021-02-26 DIAGNOSIS — Z7984 Long term (current) use of oral hypoglycemic drugs: Secondary | ICD-10-CM | POA: Diagnosis not present

## 2021-02-26 DIAGNOSIS — I10 Essential (primary) hypertension: Secondary | ICD-10-CM | POA: Diagnosis not present

## 2021-02-26 DIAGNOSIS — R109 Unspecified abdominal pain: Secondary | ICD-10-CM | POA: Insufficient documentation

## 2021-02-26 DIAGNOSIS — K219 Gastro-esophageal reflux disease without esophagitis: Secondary | ICD-10-CM | POA: Insufficient documentation

## 2021-02-26 DIAGNOSIS — E039 Hypothyroidism, unspecified: Secondary | ICD-10-CM | POA: Diagnosis not present

## 2021-02-26 DIAGNOSIS — E119 Type 2 diabetes mellitus without complications: Secondary | ICD-10-CM | POA: Insufficient documentation

## 2021-02-26 LAB — COMPREHENSIVE METABOLIC PANEL
ALT: 28 U/L (ref 0–44)
AST: 20 U/L (ref 15–41)
Albumin: 4.4 g/dL (ref 3.5–5.0)
Alkaline Phosphatase: 60 U/L (ref 38–126)
Anion gap: 7 (ref 5–15)
BUN: 22 mg/dL (ref 8–23)
CO2: 24 mmol/L (ref 22–32)
Calcium: 9.1 mg/dL (ref 8.9–10.3)
Chloride: 102 mmol/L (ref 98–111)
Creatinine, Ser: 1.16 mg/dL (ref 0.61–1.24)
GFR, Estimated: >60 mL/min (ref >60)
Glucose, Bld: 197 mg/dL — ABNORMAL HIGH (ref 70–99)
Potassium: 4.8 mmol/L (ref 3.5–5.1)
Sodium: 133 mmol/L — ABNORMAL LOW (ref 135–145)
Total Bilirubin: 0.6 mg/dL (ref 0.3–1.2)
Total Protein: 6.8 g/dL (ref 6.5–8.1)

## 2021-02-26 LAB — URINALYSIS, ROUTINE W REFLEX MICROSCOPIC
Bilirubin Urine: NEGATIVE
Glucose, UA: 100 mg/dL — AB
Hgb urine dipstick: NEGATIVE
Ketones, ur: NEGATIVE mg/dL
Leukocytes,Ua: NEGATIVE
Nitrite: NEGATIVE
Protein, ur: NEGATIVE mg/dL
Specific Gravity, Urine: 1.01 (ref 1.005–1.030)
pH: 6 (ref 5.0–8.0)

## 2021-02-26 LAB — CBC WITH DIFFERENTIAL/PLATELET
Abs Immature Granulocytes: 0.04 10*3/uL (ref 0.00–0.07)
Basophils Absolute: 0.1 10*3/uL (ref 0.0–0.1)
Basophils Relative: 1 %
Eosinophils Absolute: 0.2 10*3/uL (ref 0.0–0.5)
Eosinophils Relative: 3 %
HCT: 43.7 % (ref 39.0–52.0)
Hemoglobin: 14.7 g/dL (ref 13.0–17.0)
Immature Granulocytes: 1 %
Lymphocytes Relative: 24 %
Lymphs Abs: 1.6 10*3/uL (ref 0.7–4.0)
MCH: 29.9 pg (ref 26.0–34.0)
MCHC: 33.6 g/dL (ref 30.0–36.0)
MCV: 88.8 fL (ref 80.0–100.0)
Monocytes Absolute: 0.4 10*3/uL (ref 0.1–1.0)
Monocytes Relative: 6 %
Neutro Abs: 4.6 10*3/uL (ref 1.7–7.7)
Neutrophils Relative %: 65 %
Platelets: 188 10*3/uL (ref 150–400)
RBC: 4.92 MIL/uL (ref 4.22–5.81)
RDW: 12.9 % (ref 11.5–15.5)
WBC: 6.9 10*3/uL (ref 4.0–10.5)
nRBC: 0 % (ref 0.0–0.2)

## 2021-02-26 LAB — LIPASE, BLOOD: Lipase: 54 U/L — ABNORMAL HIGH (ref 11–51)

## 2021-02-26 MED ORDER — FENTANYL CITRATE PF 50 MCG/ML IJ SOSY
50.0000 ug | PREFILLED_SYRINGE | Freq: Once | INTRAMUSCULAR | Status: AC
  Administered 2021-02-26: 08:00:00 50 ug via INTRAVENOUS
  Filled 2021-02-26: qty 1

## 2021-02-26 MED ORDER — ONDANSETRON HCL 4 MG/2ML IJ SOLN
4.0000 mg | Freq: Once | INTRAMUSCULAR | Status: AC
  Administered 2021-02-26: 08:00:00 4 mg via INTRAVENOUS
  Filled 2021-02-26: qty 2

## 2021-02-26 MED ORDER — KETOROLAC TROMETHAMINE 15 MG/ML IJ SOLN
15.0000 mg | Freq: Once | INTRAMUSCULAR | Status: AC
  Administered 2021-02-26: 10:00:00 15 mg via INTRAVENOUS
  Filled 2021-02-26: qty 1

## 2021-02-26 MED ORDER — SODIUM CHLORIDE 0.9 % IV SOLN
INTRAVENOUS | Status: DC
  Administered 2021-02-26: 08:00:00 1000 mL via INTRAVENOUS

## 2021-02-26 NOTE — ED Provider Notes (Signed)
[facility] EMERGENCY DEPARTMENT Provider Note   CSN: [id number] Arrival date & time: 02/26/21  3419     History Chief Complaint  Patient presents with   Flank Pain    Left    Bradley Acevedo is a 75 y.o. male.  HPI Patient presents with his daughter who assists with history.  He presents due to concern of left flank pain.  He notes that over the past month he has had episodic waxing, waning periods of left flank pain.  However yesterday developed sustained pain in the left flank rating towards left inguinal crease.  Pain is severe, minimally improved with Aleve.  No hematuria, dysuria.  He saw his physician yesterday, was scheduled for outpatient CT, prescribed medications.  Those medications, unknown, are not yet available at local pharmacy, and CT has not yet occurred.  Today, with worsening pain he presents for evaluation.    Past Medical History:  Diagnosis Date   Acid reflux    CAD (coronary artery disease)    Moderate LAD disease May 2022 managed medically   Diverticulosis    Hypertension    Hypothyroid    IBS (irritable bowel syndrome)    Type 2 diabetes mellitus [facility])     Patient Active Problem List   Diagnosis Date Noted   Abnormal myocardial perfusion study    Chest pain 06/28/2020   Diabetes mellitus without complication [facility])     Past Surgical History:  Procedure Laterality Date   CIRCUMCISION     COLONOSCOPY     COLONOSCOPY  06/25/2016   [place], [place] by [name]   INTRAVASCULAR PRESSURE WIRE/FFR STUDY N/A 07/25/2020   Procedure: INTRAVASCULAR PRESSURE WIRE/FFR STUDY;  Surgeon: [name], MD;  Location: [facility] INVASIVE CV LAB;  Service: Cardiovascular;  Laterality: N/A;   LEFT HEART CATH AND CORONARY ANGIOGRAPHY N/A 07/25/2020   Procedure: LEFT HEART CATH AND CORONARY ANGIOGRAPHY;  Surgeon: [name], MD;  Location: [facility] INVASIVE CV LAB;  Service: Cardiovascular;  Laterality: N/A;   meniscus left knee     MENISCUS REPAIR Left 05/28/2017    [place], [place] by Dr. [name]   TONSILLECTOMY  1953   [place], [place]       Family History  Problem Relation Age of Onset   Hypertension Mother    Hypertension Father     Social History   Tobacco Use   Smoking status: Never   Smokeless tobacco: Never  Vaping Use   Vaping Use: Never used  Substance Use Topics   Alcohol use: Not Currently   Drug use: Not Currently    Home Medications Prior to Admission medications   Medication Sig Start Date End Date Taking? Authorizing Provider  amLODipine (NORVASC) 5 MG tablet TAKE 1/2 TABLET BY MOUTH DAILY 10/21/20   Jonelle Sidle, MD  aspirin EC 81 MG EC tablet Take 1 tablet (81 mg total) by mouth daily. Swallow whole. Patient taking differently: Take 81 mg by mouth in the morning. Swallow whole. 06/30/20   Sherryll Burger, Pratik D, DO  Cholecalciferol (D3 DOTS) 50 MCG (2000 UT) TBDP Take 2,000 Units by mouth in the morning.    [provider]  hyoscyamine (ANASPAZ) 0.125 MG TBDP disintergrating tablet Take 0.125-0.25 mg by mouth every 4 (four) hours as needed for bladder spasms or cramping. 01/29/20   [provider]  levothyroxine (SYNTHROID) 50 MCG tablet Take 50 mcg by mouth daily before breakfast. 03/22/20   [provider]  lisinopril (ZESTRIL) 10 MG tablet Take 1  tablet (10 mg total) by mouth in the morning. 08/19/20   Imogene Burn, PA-C  metFORMIN (GLUCOPHAGE) 500 MG tablet Take 500 mg by mouth in the morning. 06/19/20   [provider]  metoprolol succinate (TOPROL XL) 50 MG 24 hr tablet Take 1 tablet (50 mg total) by mouth daily. Take with or immediately following a meal. 12/24/20   Satira Sark, MD  nitroGLYCERIN (NITROSTAT) 0.4 MG SL tablet Place 1 tablet (0.4 mg total) under the tongue every 5 (five) minutes as needed for chest pain. Patient taking differently: Place 0.4 mg under the tongue every 5 (five) minutes x 3 doses as needed for chest pain. 06/29/20   Manuella Ghazi, Pratik D, DO  ondansetron  (ZOFRAN-ODT) 4 MG disintegrating tablet Take 4 mg by mouth every 8 (eight) hours as needed for nausea or vomiting. 01/29/20   [provider]  pantoprazole (PROTONIX) 40 MG tablet Take 1 tablet (40 mg total) by mouth daily. Patient taking differently: Take 40 mg by mouth in the morning. 06/30/20 02/21/21  Manuella Ghazi, Pratik D, DO  Probiotic Product (PROBIOTIC PO) Take 1 capsule by mouth in the morning.    [provider]  rosuvastatin (CRESTOR) 5 MG tablet Take 1 tablet (5 mg total) by mouth daily. Patient taking differently: Take 5 mg by mouth daily after lunch. 07/16/20 02/21/21  Satira Sark, MD    Allergies    Patient has no known allergies.  Review of Systems   Review of Systems  Constitutional:        Per HPI, otherwise negative  HENT:         Per HPI, otherwise negative  Respiratory:         Per HPI, otherwise negative  Cardiovascular:        Per HPI, otherwise negative  Gastrointestinal:  Negative for vomiting.  Endocrine:       Negative aside from HPI  Genitourinary:        Neg aside from HPI   Musculoskeletal:        Per HPI, otherwise negative  Skin: Negative.   Neurological:  Negative for syncope.   Physical Exam Updated Vital Signs BP 121/74    Pulse 70    Temp 97.8 F (36.6 C)    Resp 18    Ht 5\' 10"  (1.778 m)    Wt 109.3 kg    SpO2 100%    BMI 34.58 kg/m   Physical Exam Vitals and nursing note reviewed.  Constitutional:      General: He is not in acute distress.    Appearance: He is well-developed.  HENT:     Head: Normocephalic and atraumatic.  Eyes:     Conjunctiva/sclera: Conjunctivae normal.  Cardiovascular:     Rate and Rhythm: Normal rate and regular rhythm.  Pulmonary:     Effort: Pulmonary effort is normal. No respiratory distress.     Breath sounds: No stridor.  Abdominal:     General: There is no distension.     Tenderness: There is left CVA tenderness.  Skin:    General: Skin is warm and dry.  Neurological:     Mental  Status: He is alert and oriented to person, place, and time.    ED Results / Procedures / Treatments   Labs (all labs ordered are listed, but only abnormal results are displayed) Labs Reviewed  COMPREHENSIVE METABOLIC PANEL - Abnormal; Notable for the following components:      Result Value   Sodium  133 (*)    Glucose, Bld 197 (*)    All other components within normal limits  LIPASE, BLOOD - Abnormal; Notable for the following components:   Lipase 54 (*)    All other components within normal limits  URINALYSIS, ROUTINE W REFLEX MICROSCOPIC - Abnormal; Notable for the following components:   Glucose, UA 100 (*)    All other components within normal limits  CBC WITH DIFFERENTIAL/PLATELET    EKG None  Radiology CT RENAL STONE STUDY  Result Date: 02/26/2021 CLINICAL DATA:  LEFT flank pain without hematuria for 2-3 days, nephrolithiasis EXAM: CT ABDOMEN AND PELVIS WITHOUT CONTRAST TECHNIQUE: Multidetector CT imaging of the abdomen and pelvis was performed following the standard protocol without IV contrast. COMPARISON:  01/22/2020 FINDINGS: Lower chest: Lung bases clear Hepatobiliary: Tiny dependent density at lower gallbladder segment likely tiny calculus. Gallbladder and liver otherwise normal appearance Pancreas: Few parenchymal calcifications question chronic calcific pancreatitis. No focal mass. Spleen: Few tiny calcified granulomata. Subtle low-attenuation lesion inferior spleen 11 mm diameter, nonspecific but unchanged. Adrenals/Urinary Tract: Adrenal glands normal appearance. Mild perinephric stranding bilaterally. Small cyst medial aspect inferior pole LEFT kidney 13 mm diameter. Peripelvic cysts bilaterally. No urinary tract calcification, hydronephrosis, or hydroureter. Bladder unremarkable. Stomach/Bowel: Normal appendix. Distal colonic diverticulosis without evidence of diverticulitis. Bowel loops otherwise unremarkable. Stomach normal appearance. Vascular/Lymphatic:  Atherosclerotic calcifications aorta and iliac arteries without aneurysm. No adenopathy. Reproductive: Minimal prostatic prominence. Seminal vesicles unremarkable. Other: No free air or free fluid. Tiny umbilical hernia containing fat. No inflammatory process identified. Musculoskeletal: Degenerative facet disease changes lower lumbar spine. No acute osseous findings. IMPRESSION: Distal colonic diverticulosis without evidence of diverticulitis. BILATERAL peripelvic and small LEFT renal cysts. No urinary tract calcification or dilatation. Probable tiny gallstone. Tiny umbilical hernia containing fat. Aortic Atherosclerosis (ICD10-I70.0). Electronically Signed   By: Lavonia Dana M.D.   On: 02/26/2021 08:49    Procedures Procedures   Medications Ordered in ED Medications  0.9 %  sodium chloride infusion (0 mLs Intravenous Stopped 02/26/21 0912)  ondansetron (ZOFRAN) injection 4 mg (4 mg Intravenous Given 02/26/21 0810)  fentaNYL (SUBLIMAZE) injection 50 mcg (50 mcg Intravenous Given 02/26/21 0810)  ketorolac (TORADOL) 15 MG/ML injection 15 mg (15 mg Intravenous Given 02/26/21 [id number])    ED Course  I have reviewed the triage vital signs and the nursing notes.  Pertinent labs & imaging results that were available during my care of the patient were reviewed by me and considered in my medical decision making (see chart for details).  9:41 AM On repeat exam patient awake, alert, in no distress, states that he feels somewhat better.  Blood pressure substantially better, and hemodynamically he is unremarkable.  Discussed all findings and I reviewed the CT scan.  Urinalysis, labs reassuring.  Trivial elevation in lipase, but no upper abdominal pain, no vomiting, low suspicion for acute pancreatitis.  Some suspicion for recently passed kidney stone given mild perinephric stranding, flank pain, patient's provisional history from prior outpatient evaluation.  No evidence for other acute intra-abdominal findings.   Patient has improved here, will receive Toradol, be discharged to follow-up with primary care.    MDM Rules/Calculators/A&P  MDM Number of Diagnoses or Management Options Left flank pain: established, worsening   Amount and/or Complexity of Data Reviewed Clinical lab tests: ordered and reviewed Tests in the radiology section of CPT: ordered and reviewed Tests in the medicine section of CPT: reviewed and ordered Decide to obtain previous medical records or to obtain history from someone  other than the patient: yes Obtain history from someone other than the patient: yes Review and summarize past medical records: yes Independent visualization of images, tracings, or specimens: yes  Risk of Complications, Morbidity, and/or Mortality Presenting problems: high Diagnostic procedures: high Management options: high  Critical Care Total time providing critical care: < 30 minutes  Patient Progress Patient progress: improved   Final Clinical Impression(s) / ED Diagnoses Final diagnoses:  Left flank pain    Rx / DC Orders ED Discharge Orders     None        Carmin Muskrat, MD 02/26/21 [phone]

## 2021-02-26 NOTE — Discharge Instructions (Addendum)
As discussed, your evaluation today has been largely reassuring.  But, it is important that you monitor your condition carefully, and do not hesitate to return to the ED if you develop new, or concerning changes in your condition.  Otherwise, please follow-up with your physician for appropriate ongoing care.  Please be sure to obtain and take the medication prescribed previously that should be available tomorrow.  Below is a copy of the CT results from today's study.  Please discuss this with your physician at your next appointment.  FINDINGS: Lower chest: Lung bases clear   Hepatobiliary: Tiny dependent density at lower gallbladder segment likely tiny calculus. Gallbladder and liver otherwise normal appearance   Pancreas: Few parenchymal calcifications question chronic calcific pancreatitis. No focal mass.   Spleen: Few tiny calcified granulomata. Subtle low-attenuation lesion inferior spleen 11 mm diameter, nonspecific but unchanged.   Adrenals/Urinary Tract: Adrenal glands normal appearance. Mild perinephric stranding bilaterally. Small cyst medial aspect inferior pole LEFT kidney 13 mm diameter. Peripelvic cysts bilaterally. No urinary tract calcification, hydronephrosis, or hydroureter. Bladder unremarkable.   Stomach/Bowel: Normal appendix. Distal colonic diverticulosis without evidence of diverticulitis. Bowel loops otherwise unremarkable. Stomach normal appearance.   Vascular/Lymphatic: Atherosclerotic calcifications aorta and iliac arteries without aneurysm. No adenopathy.   Reproductive: Minimal prostatic prominence. Seminal vesicles unremarkable.   Other: No free air or free fluid. Tiny umbilical hernia containing fat. No inflammatory process identified.   Musculoskeletal: Degenerative facet disease changes lower lumbar spine. No acute osseous findings.

## 2021-02-26 NOTE — ED Triage Notes (Signed)
Reports intermittent L flank pain for a while that has became worse over the past 2 day.  Seen Pcp yesterday and is treating for kidney stone.  No imaging was done to confirm this.  Pt states pain is bending him over and was unable to get med from pharmacy due to them being out of stock.  Resp even and unlabored.

## 2021-02-27 ENCOUNTER — Emergency Department (HOSPITAL_COMMUNITY): Payer: Medicare Other

## 2021-02-27 ENCOUNTER — Encounter (HOSPITAL_COMMUNITY): Payer: Self-pay | Admitting: Emergency Medicine

## 2021-02-27 ENCOUNTER — Other Ambulatory Visit: Payer: Self-pay

## 2021-02-27 ENCOUNTER — Emergency Department (HOSPITAL_COMMUNITY)
Admission: EM | Admit: 2021-02-27 | Discharge: 2021-02-27 | Disposition: A | Discharge status: Home / Self Care | Payer: Medicare Other | Attending: Emergency Medicine | Admitting: Emergency Medicine

## 2021-02-27 DIAGNOSIS — R109 Unspecified abdominal pain: Secondary | ICD-10-CM | POA: Diagnosis not present

## 2021-02-27 DIAGNOSIS — E039 Hypothyroidism, unspecified: Secondary | ICD-10-CM | POA: Insufficient documentation

## 2021-02-27 DIAGNOSIS — I10 Essential (primary) hypertension: Secondary | ICD-10-CM | POA: Diagnosis not present

## 2021-02-27 DIAGNOSIS — E119 Type 2 diabetes mellitus without complications: Secondary | ICD-10-CM | POA: Insufficient documentation

## 2021-02-27 DIAGNOSIS — I2510 Atherosclerotic heart disease of native coronary artery without angina pectoris: Secondary | ICD-10-CM | POA: Insufficient documentation

## 2021-02-27 MED ORDER — METHOCARBAMOL 500 MG PO TABS: 500.0000 mg | ORAL_TABLET | Freq: Three times a day (TID) | ORAL | 0 refills | Status: DC | PRN

## 2021-02-27 MED ORDER — METHOCARBAMOL 500 MG PO TABS
1000.0000 mg | ORAL_TABLET | Freq: Once | ORAL | Status: AC
  Administered 2021-02-27: 07:00:00 1000 mg via ORAL
  Filled 2021-02-27: qty 2

## 2021-02-27 MED ORDER — LIDOCAINE 5 % EX PTCH: 1.0000 | MEDICATED_PATCH | CUTANEOUS | 0 refills | Status: DC

## 2021-02-27 MED ORDER — ACETAMINOPHEN 500 MG PO TABS
1000.0000 mg | ORAL_TABLET | Freq: Once | ORAL | Status: AC
  Administered 2021-02-27: 07:00:00 1000 mg via ORAL
  Filled 2021-02-27: qty 2

## 2021-02-27 NOTE — Discharge Instructions (Addendum)
You were evaluated in the Emergency Department and after careful evaluation, we did not find any emergent condition requiring admission or further testing in the hospital.  Your exam/testing today was overall reassuring.  CT scan did not show any changes.  Symptoms could be due to a recently passed kidney stone but might just be due to an inflamed muscle that is spasming.  Fill the prescriptions for the Naprosyn and the methocarbamol prescribed by your primary care doctor.  Can also use the Lidoderm patches prescribed.  Recommend light stretching if tolerable, heating pads.  Please return to the Emergency Department if you experience any worsening of your condition.  Thank you for allowing [place] to be a part of your care.

## 2021-02-27 NOTE — ED Provider Notes (Addendum)
[facility] Emergency Department Provider Note MRN:  [id number]  Arrival date & time: 02/27/21     Chief Complaint   Flank Pain   History of Present Illness   Bradley Acevedo is a 75 y.o. year-old male with a history of diabetes, CAD, hypertension presenting to the ED with chief complaint of flank pain.  Location: Left flank Duration: 2 months, worse over the past 2 days Onset: Sudden Timing: Intermittent Description: Sharp Severity: Severe Exacerbating/Alleviating Factors: Worse with certain movements Associated Symptoms: None Pertinent Negatives: No dysuria, no hematuria, no numbness or weakness to the arms or legs, no bowel or bladder dysfunction  Additional History: Was here yesterday in the emergency department.   Review of Systems  A complete 10 system review of systems was obtained and all systems are negative except as noted in the HPI and PMH.   Patient's Health History    Past Medical History:  Diagnosis Date   Acid reflux    CAD (coronary artery disease)    Moderate LAD disease May 2022 managed medically   Diverticulosis    Hypertension    Hypothyroid    IBS (irritable bowel syndrome)    Type 2 diabetes mellitus [facility])     Past Surgical History:  Procedure Laterality Date   CIRCUMCISION     COLONOSCOPY     COLONOSCOPY  06/25/2016   [place], [place] by Dr. Malen Gauze   INTRAVASCULAR PRESSURE WIRE/FFR STUDY N/A 07/25/2020   Procedure: INTRAVASCULAR PRESSURE WIRE/FFR STUDY;  Surgeon: Lorretta Harp, MD;  Location: [place] CV LAB;  Service: Cardiovascular;  Laterality: N/A;   LEFT HEART CATH AND CORONARY ANGIOGRAPHY N/A 07/25/2020   Procedure: LEFT HEART CATH AND CORONARY ANGIOGRAPHY;  Surgeon: Lorretta Harp, MD;  Location: [place] CV LAB;  Service: Cardiovascular;  Laterality: N/A;   meniscus left knee     MENISCUS REPAIR Left 05/28/2017   [place], [place] by Dr. Alroy Dust   TONSILLECTOMY  1953   [place], [place]    Family  History  Problem Relation Age of Onset   Hypertension Mother    Hypertension Father     Social History   Socioeconomic History   Marital status: Widowed    Spouse name: Not on file   Number of children: Not on file   Years of education: Not on file   Highest education level: Not on file  Occupational History   Not on file  Tobacco Use   Smoking status: Never   Smokeless tobacco: Never  Vaping Use   Vaping Use: Never used  Substance and Sexual Activity   Alcohol use: Not Currently   Drug use: Not Currently   Sexual activity: Not on file  Other Topics Concern   Not on file  Social History Narrative   Not on file   Social Determinants of Health   Financial Resource Strain: Not on file  Food Insecurity: Not on file  Transportation Needs: Not on file  Physical Activity: Not on file  Stress: Not on file  Social Connections: Not on file  Intimate Partner Violence: Not on file     Physical Exam   Vitals:   02/27/21 0619  BP: (!) 155/82  Pulse: 84  Resp: 17  Temp: 97.7 F (36.5 C)  SpO2: 99%    CONSTITUTIONAL: Well-appearing, NAD NEURO:  Alert and oriented x 3, no focal deficits EYES:  eyes equal and reactive ENT/NECK:  no LAD, no JVD CARDIO: Regular rate, well-perfused, normal S1 and S2  PULM:  CTAB no wheezing or rhonchi GI/GU:  normal bowel sounds, non-distended, non-tender MSK/SPINE:  No gross deformities, no edema SKIN:  no rash, atraumatic PSYCH:  Appropriate speech and behavior  *Additional and/or pertinent findings included in MDM below  Diagnostic and Interventional Summary    EKG Interpretation  Date/Time:    Ventricular Rate:    PR Interval:    QRS Duration:   QT Interval:    QTC Calculation:   R Axis:     Text Interpretation:         Labs Reviewed - No data to display  CT RENAL STONE STUDY  Final Result      Medications  methocarbamol (ROBAXIN) tablet 1,000 mg (1,000 mg Oral Given 02/27/21 0646)  acetaminophen (TYLENOL) tablet  1,000 mg (1,000 mg Oral Given 02/27/21 0646)     Procedures  /  Critical Care Procedures  ED Course and Medical Decision Making  I have reviewed the triage vital signs, the nursing notes, and pertinent available records from the EMR.  Listed above are laboratory and imaging tests that I personally ordered, reviewed, and interpreted and then considered in my medical decision making (see below for details).  Question of kidney stone versus MSK.  Yesterday had some mild perinephric stranding on his CT scan.  However his urinalysis was normal, no blood.  His pain is worse with certain motions, which would suggest more of an MSK.  We will repeat CT scan to look for any signs of worsening renal pathology.  Providing pain control and will reassess labs and urinalysis less than 24 hours ago, I see little benefit in repeating unless CT scan is markedly abnormal.    CT scan is unchanged.  Upon further questioning patient has been hauling leaves from his yard recently, suspect sprain and spasm, appropriate for discharge.   Elmer Sow. Pilar Plate, MD [facility] Emergency Medicine [facility] [email or web address]  Final Clinical Impressions(s) / ED Diagnoses     ICD-10-CM   1. Flank pain  R10.9           Discharge Instructions Discussed with and Provided to Patient:        Sabas Sous, MD 02/27/21 2683    Sabas Sous, MD 02/27/21 [phone]

## 2021-02-27 NOTE — ED Triage Notes (Signed)
Pt to the ED with Left flank pain significantly worse than yesterday. Pt was seen here yesterday for the same.

## 2021-08-13 ENCOUNTER — Other Ambulatory Visit: Payer: Self-pay | Admitting: Cardiology

## 2021-08-18 ENCOUNTER — Other Ambulatory Visit: Payer: Self-pay | Admitting: Physician Assistant

## 2021-09-08 ENCOUNTER — Other Ambulatory Visit: Payer: Self-pay | Admitting: Physician Assistant

## 2021-09-29 ENCOUNTER — Encounter: Payer: Self-pay | Admitting: Physician Assistant

## 2021-09-29 ENCOUNTER — Telehealth: Payer: Self-pay | Admitting: Cardiology

## 2021-09-29 ENCOUNTER — Ambulatory Visit (INDEPENDENT_AMBULATORY_CARE_PROVIDER_SITE_OTHER): Payer: Medicare Other | Admitting: Physician Assistant

## 2021-09-29 VITALS — BP 147/77 | HR 62 | Ht 70.0 in | Wt 244.0 lb

## 2021-09-29 DIAGNOSIS — I2510 Atherosclerotic heart disease of native coronary artery without angina pectoris: Secondary | ICD-10-CM | POA: Diagnosis not present

## 2021-09-29 DIAGNOSIS — I10 Essential (primary) hypertension: Secondary | ICD-10-CM

## 2021-09-29 DIAGNOSIS — R079 Chest pain, unspecified: Secondary | ICD-10-CM

## 2021-09-29 DIAGNOSIS — E119 Type 2 diabetes mellitus without complications: Secondary | ICD-10-CM

## 2021-09-29 DIAGNOSIS — E785 Hyperlipidemia, unspecified: Secondary | ICD-10-CM

## 2021-09-29 MED ORDER — ISOSORBIDE MONONITRATE ER 30 MG PO TB24: 30.0000 mg | ORAL_TABLET | Freq: Every day | ORAL | 3 refills | Status: DC

## 2021-09-29 MED ORDER — NITROGLYCERIN 0.4 MG SL SUBL: 0.4000 mg | SUBLINGUAL_TABLET | SUBLINGUAL | 3 refills | Status: DC | PRN

## 2021-09-29 NOTE — Progress Notes (Signed)
Cardiology Office Note    Date:  09/29/2021   ID:  Bradley Acevedo, DOB [birth year], MRN [id number]   PCP:  Bradley Acevedo   [facility]  Cardiologist:  Nona Dell, MD   Advanced Practice Provider:  No care team member to display Electrophysiologist:  None   [id number]   Chief Complaint  Patient presents with   Chest Pain    History of Present Illness:  Bradley Acevedo is a 76 y.o. male  hospitalized with chest pain 06/2020 troponins negative EKG without acute change, echo LVEF 55 to 60% without wall motion abnormalities.  Lexiscan Myoview showed evidence of inferior/inferior apical infarct scar mild peri-infarct ischemia.  Dr. Diona Acevedo recommend cardiac catheterization because of recurrent chest pain at low levels of activity.  Cardiac cath 07/25/20 showed a long proximal to mid LAD 50% stenosis that was long and borderline significant RFR.  Antianginals were recommended with intervention only for recalcitrant symptoms.   Patient saw Dr. Diona Acevedo 02/21/2021 and was doing well without much angina.  Patient called in today complaining of chest pressure and wanted to be checked before he leaves town this weekend. Describes the pain as a heavy weight-occurred at rest this am-lasted 30-60 min.He walked 30-40 min after the chest pain and had no symptoms. He is stressed about his drive to [place] this weekend. Sometimes occurs at rest or with exertion like digging potatoes. Hasn't used NTG.     Past Medical History:  Diagnosis Date   Acid reflux    CAD (coronary artery disease)    Moderate LAD disease May 2022 managed medically   Diverticulosis    Hypertension    Hypothyroid    IBS (irritable bowel syndrome)    Type 2 diabetes mellitus [facility])     Past Surgical History:  Procedure Laterality Date   CIRCUMCISION     COLONOSCOPY     COLONOSCOPY  06/25/2016   [place], [place] by [name]   INTRAVASCULAR PRESSURE WIRE/FFR STUDY N/A 07/25/2020    Procedure: INTRAVASCULAR PRESSURE WIRE/FFR STUDY;  Surgeon: [name], MD;  [facility];  Service: Cardiovascular;  Laterality: N/A;   LEFT HEART CATH AND CORONARY ANGIOGRAPHY N/A 07/25/2020   Procedure: LEFT HEART CATH AND CORONARY ANGIOGRAPHY;  Surgeon: [name], MD;  Location: [facility] CV LAB;  Service: Cardiovascular;  Laterality: N/A;   meniscus left knee     MENISCUS REPAIR Left 05/28/2017   [place], [place] by Dr. Clovis Acevedo   TONSILLECTOMY  1953   [place], [place]    Current Medications: Current Meds  Medication Sig   amLODipine (NORVASC) 5 MG tablet TAKE 1/2 TABLET(2.5MG       TOTAL) DAILY   aspirin EC 81 MG EC tablet Take 1 tablet (81 mg total) by mouth daily. Swallow whole. (Patient taking differently: Take 81 mg by mouth in the morning. Swallow whole.)   Cholecalciferol (D3 DOTS) 50 MCG (2000 UT) TBDP Take 2,000 Units by mouth in the morning.   hyoscyamine (ANASPAZ) 0.125 MG TBDP disintergrating tablet Take 0.125-0.25 mg by mouth every 4 (four) hours as needed for bladder spasms or cramping.   levothyroxine (SYNTHROID) 50 MCG tablet Take 50 mcg by mouth daily before breakfast.   lisinopril (ZESTRIL) 10 MG tablet TAKE 1 TABLET EVERY MORNING   metFORMIN (GLUCOPHAGE) 500 MG tablet Take 500 mg by mouth in the morning.   metoprolol succinate (TOPROL-XL) 50 MG 24 hr tablet TAKE 1 TABLET DAILY   ondansetron (  ZOFRAN-ODT) 4 MG disintegrating tablet Take 4 mg by mouth every 8 (eight) hours as needed for nausea or vomiting.   Probiotic Product (PROBIOTIC PO) Take 1 capsule by mouth in the morning.   [DISCONTINUED] isosorbide mononitrate (IMDUR) 30 MG 24 hr tablet Take 1 tablet (30 mg total) by mouth daily.   [DISCONTINUED] nitroGLYCERIN (NITROSTAT) 0.4 MG SL tablet Place 1 tablet (0.4 mg total) under the tongue every 5 (five) minutes as needed for chest pain. (Patient taking differently: Place 0.4 mg under the tongue every 5 (five) minutes x 3 doses as needed for  chest pain.)   Current Facility-Administered Medications for the 09/29/21 encounter (Office Visit) with [name], PA-C  Medication   sodium chloride flush (NS) 0.9 % injection 3 mL     Allergies:   Patient has no known allergies.   Social History   Socioeconomic History   Marital status: Widowed    Spouse name: Not on file   Number of children: Not on file   Years of education: Not on file   Highest education level: Not on file  Occupational History   Not on file  Tobacco Use   Smoking status: Never   Smokeless tobacco: Never  Vaping Use   Vaping Use: Never used  Substance and Sexual Activity   Alcohol use: Not Currently   Drug use: Not Currently   Sexual activity: Not on file  Other Topics Concern   Not on file  Social History Narrative   Not on file   Social Determinants of Health   Financial Resource Strain: Not on file  Food Insecurity: Not on file  Transportation Needs: Not on file  Physical Activity: Not on file  Stress: Not on file  Social Connections: Not on file     Family History:  The patient's  family history includes Hypertension in his father and mother.   ROS:   Please see the history of present illness.    ROS All other systems reviewed and are negative.   PHYSICAL EXAM:   VS:  BP (!) 147/77 (BP Location: Right Arm, Patient Position: Sitting, Cuff Size: Large)   Pulse 62   Ht 5\' 10"  (1.778 m)   Wt 244 lb (110.7 kg)   SpO2 98%   BMI 35.01 kg/m   Physical Exam  GEN: Obese, in no acute distress  Neck: no JVD, carotid bruits, or masses Cardiac:RRR; no murmurs, rubs, or gallops  Respiratory:  clear to auscultation bilaterally, normal work of breathing GI: soft, nontender, nondistended, + BS Ext: without cyanosis, clubbing, or edema, Good distal pulses bilaterally Neuro:  Alert and Oriented x 3, Psych: euthymic mood, full affect  Wt Readings from Last 3 Encounters:  09/29/21 244 lb (110.7 kg)  02/27/21 241 lb (109.3 kg)  02/26/21  241 lb (109.3 kg)      Studies/Labs Reviewed:   EKG:  EKG is  ordered today.  The ekg ordered today demonstrates NSR normal EKG no change from prior tracings  Recent Labs: 02/26/2021: ALT 28; BUN 22; Creatinine, Ser 1.16; Hemoglobin 14.7; Platelets 188; Potassium 4.8; Sodium 133   Lipid Panel    Component Value Date/Time   CHOL 98 06/28/2020 1035   TRIG 101 06/28/2020 1035   HDL 29 (L) 06/28/2020 1035   CHOLHDL 3.4 06/28/2020 1035   VLDL 20 06/28/2020 1035   LDLCALC 49 06/28/2020 1035    Additional studies/ records that were reviewed today include:  Echocardiogram 06/28/2020:  1. Left ventricular ejection fraction,  by estimation, is 55 to 60%. The  left ventricle has normal function. The left ventricle has no regional  wall motion abnormalities. There is mild left ventricular hypertrophy.  Left ventricular diastolic parameters  were normal.   2. Right ventricular systolic function is normal. The right ventricular  size is normal. Tricuspid regurgitation signal is inadequate for assessing  PA pressure.   3. The mitral valve is grossly normal. Trivial mitral valve  regurgitation.   4. The aortic valve is tricuspid. Aortic valve regurgitation is not  visualized.   5. The inferior vena cava is normal in size with greater than 50%  respiratory variability, suggesting right atrial pressure of 3 mmHg.    Cardiac catheterization 07/25/2020: Prox LAD to Mid LAD lesion is 50% stenosed.   IMPRESSION: Mr. Suppes has an intermediate fairly long proximal LAD lesion with a borderline significant RFR on minimal antianginals.  He required a fairly long stent and a relatively smallish caliber LAD beginning near ostial.  I recommend optimizing antianginal medications and recommend intervention for recalcitrant symptoms.     Risk Assessment/Calcul ations:        ASSESSMENT:    1. Chest pain, unspecified type   2. Coronary artery disease involving native coronary artery of native  heart, unspecified whether angina present   3. Essential hypertension   4. Type 2 diabetes mellitus without complication, without long-term current use of insulin (HCC)   5. Hyperlipidemia, unspecified hyperlipidemia type      PLAN:  In order of problems listed above:  Chest pain with nonobstructive CAD on cath  50% proximal to mid LAD with borderline significant RFR.  Plan for increase antianginals and intervention only for recalcitrant symptoms.  Patient has had an increase in chest pressure the past couple weeks but not daily and not always exertional. This am at rest lasted 30-60 min but walked 40 min after without symptoms. Under some stress which has contributed in the past. EKG today unchanged. Will add Imdur 30 mg once daily. Refill NTG and informed him on how to take. ER precautions reviewed. I'll see him back and decide if further testing needed.   HTN blood pressure up a bit today and can tolerate addition of Imdur.   DM2 A1c  was >7 recently. To start on Jardiance   Hyperlipidemia LDL 39 09/2021  Shared Decision Making/Informed Consent        Medication Adjustments/Labs and Tests Ordered: Current medicines are reviewed at length with the patient today.  Concerns regarding medicines are outlined above.  Medication changes, Labs and Tests ordered today are listed in the Patient Instructions below. Patient Instructions  Medication Instructions:  Your physician has recommended you make the following change in your medication:   Start Imdur 30 mg Daily    *If you need a refill on your cardiac medications before your next appointment, please call your pharmacy*   Lab Work: NONE   If you have labs (blood work) drawn today and your tests are completely normal, you will receive your results only by: MyChart Message (if you have MyChart) OR A paper copy in the mail If you have any lab test that is abnormal or we need to change your treatment, we will call you to review the  results.   Testing/Procedures: NONE    Follow-Up: At [facility], you and your health needs are our priority.  As part of our continuing mission to provide you with exceptional heart care, we have created designated Provider Care Teams.  These Care Teams include your primary Cardiologist (physician) and Advanced Practice Providers (APPs -  Physician Assistants and Nurse Practitioners) who all work together to provide you with the care you need, when you need it.  We recommend signing up for the patient portal called "MyChart".  Sign up information is provided on this After Visit Summary.  MyChart is used to connect with patients for Virtual Visits (Telemedicine).  Patients are able to view lab/test results, encounter notes, upcoming appointments, etc.  Non-urgent messages can be sent to your provider as well.   To learn more about what you can do with MyChart, go to [email or web address].    Your next appointment:    After August 3   The format for your next appointment:   In Person  Provider:   Jacolyn Reedy, PA-C    Other Instructions Thank you for choosing [facility]!    Important Information About Sugar         Elson Clan, PA-C  09/29/2021 11:11 AM    [facility] [address] [place], [place], [place]  [address] Phone: [phone]; Fax: [phone]

## 2021-09-29 NOTE — Patient Instructions (Signed)
Medication Instructions:  Your physician has recommended you make the following change in your medication:   Start Imdur 30 mg Daily    *If you need a refill on your cardiac medications before your next appointment, please call your pharmacy*   Lab Work: NONE   If you have labs (blood work) drawn today and your tests are completely normal, you will receive your results only by: MyChart Message (if you have MyChart) OR A paper copy in the mail If you have any lab test that is abnormal or we need to change your treatment, we will call you to review the results.   Testing/Procedures: NONE    Follow-Up: At [facility], you and your health needs are our priority.  As part of our continuing mission to provide you with exceptional heart care, we have created designated Provider Care Teams.  These Care Teams include your primary Cardiologist (physician) and Advanced Practice Providers (APPs -  Physician Assistants and Nurse Practitioners) who all work together to provide you with the care you need, when you need it.  We recommend signing up for the patient portal called "MyChart".  Sign up information is provided on this After Visit Summary.  MyChart is used to connect with patients for Virtual Visits (Telemedicine).  Patients are able to view lab/test results, encounter notes, upcoming appointments, etc.  Non-urgent messages can be sent to your provider as well.   To learn more about what you can do with MyChart, go to [email or web address].    Your next appointment:    After August 3   The format for your next appointment:   In Person  Provider:   Jacolyn Reedy, PA-C    Other Instructions Thank you for choosing [facility]!    Important Information About Sugar

## 2021-09-29 NOTE — Telephone Encounter (Signed)
Pt coming to office to see Leda Gauze, PA-C  Pt stated that his cp comes and goes- pt denies any other symptoms. Pt stated that he is going on a trip this weekend, but wanted to be checked out.

## 2021-09-29 NOTE — Telephone Encounter (Signed)
Pt c/o of Chest Pain: STAT if CP now or developed within 24 hours  1. Are you having CP right now? Yes, not real bad, general it feels like he has a weight on his chest.   2. Are you experiencing any other symptoms (ex. SOB, nausea, vomiting, sweating)? No other symptoms.   3. How long have you been experiencing CP? A couple of weeks.   4. Is your CP continuous or coming and going? Come and goes..   5. Have you taken Nitroglycerin? No  He is going on a trip this Saturday, he figured he get it checked out.  ?

## 2021-10-13 NOTE — Progress Notes (Signed)
Cardiology Office Note    Date:  10/20/2021   ID:  Bradley Acevedo, DOB 1945-08-30, MRN [id number]   PCP:  Ladon Applebaum   [facility]  Cardiologist:  Nona Dell, MD   Advanced Practice Provider:  No care team member to display Electrophysiologist:  None   [id number]   Chief Complaint  Patient presents with   Follow-up    History of Present Illness:  Bradley Acevedo is a 76 y.o. male hospitalized with chest pain 06/2020 troponins negative EKG without acute change, echo LVEF 55 to 60% without wall motion abnormalities.  Lexiscan Myoview showed evidence of inferior/inferior apical infarct scar mild peri-infarct ischemia.  Dr. [name] recommend cardiac catheterization because of recurrent chest pain at low levels of activity.  Cardiac cath 07/25/20 showed a long proximal to mid LAD 50% stenosis that was long and borderline significant RFR.  Antianginals were recommended with intervention only for recalcitrant symptoms.    Patient saw Dr. [name] 02/21/2021 and was doing well without much angina.  I saw the patient 09/29/21 with recurrent chest pain at rest  and some with exertion  and under a lot of stress. EKG was ok. I added Imdur and refilled his NTG.  Patient comes in for f/u. He says his chest pain is much better. Still has some light pressure at random times but usually at rest not with exertion. . Hasn't used NTG.Goes away in less than a minute. Had an IBS flair up last week but has been walking 4 blocks ~ 15 min daily without symptoms. Getting married Sept 3.    Past Medical History:  Diagnosis Date   Acid reflux    CAD (coronary artery disease)    Moderate LAD disease May 2022 managed medically   Diverticulosis    Hypertension    Hypothyroid    IBS (irritable bowel syndrome)    Type 2 diabetes mellitus [facility])     Past Surgical History:  Procedure Laterality Date   CIRCUMCISION     COLONOSCOPY     COLONOSCOPY  06/25/2016    [place], [place] by [name]   INTRAVASCULAR PRESSURE WIRE/FFR STUDY N/A 07/25/2020   Procedure: INTRAVASCULAR PRESSURE WIRE/FFR STUDY;  Surgeon: [name], MD;  [facility];  Service: Cardiovascular;  Laterality: N/A;   LEFT HEART CATH AND CORONARY ANGIOGRAPHY N/A 07/25/2020   Procedure: LEFT HEART CATH AND CORONARY ANGIOGRAPHY;  Surgeon: [name], MD;  Location: [facility];  Service: Cardiovascular;  Laterality: N/A;   meniscus left knee     MENISCUS REPAIR Left 05/28/2017   [place], [place] by Dr. [name]   TONSILLECTOMY  1953   [place], [place]    Current Medications: Current Meds  Medication Sig   amLODipine (NORVASC) 5 MG tablet TAKE 1/2 TABLET(2.5MG       TOTAL) DAILY   aspirin EC 81 MG EC tablet Take 1 tablet (81 mg total) by mouth daily. Swallow whole. (Patient taking differently: Take 81 mg by mouth in the morning. Swallow whole.)   Cholecalciferol (D3 DOTS) 50 MCG (2000 UT) TBDP Take 2,000 Units by mouth in the morning.   hyoscyamine (ANASPAZ) 0.125 MG TBDP disintergrating tablet Take 0.125-0.25 mg by mouth every 4 (four) hours as needed for bladder spasms or cramping.   isosorbide mononitrate (IMDUR) 30 MG 24 hr tablet Take 1 tablet (30 mg total) by mouth daily.   JARDIANCE 25 MG TABS tablet Take 25 mg by mouth daily.  levothyroxine (SYNTHROID) 50 MCG tablet Take 50 mcg by mouth daily before breakfast.   lidocaine (LIDODERM) 5 % Place 1 patch onto the skin daily. Remove & Discard patch within 12 hours or as directed by MD   lisinopril (ZESTRIL) 10 MG tablet TAKE 1 TABLET EVERY MORNING   metFORMIN (GLUCOPHAGE) 500 MG tablet Take 500 mg by mouth in the morning.   metoprolol succinate (TOPROL-XL) 50 MG 24 hr tablet TAKE 1 TABLET DAILY   nitroGLYCERIN (NITROSTAT) 0.4 MG SL tablet Place 1 tablet (0.4 mg total) under the tongue every 5 (five) minutes x 3 doses as needed for chest pain.   ondansetron (ZOFRAN-ODT) 4 MG disintegrating tablet Take 4 mg  by mouth every 8 (eight) hours as needed for nausea or vomiting.   pantoprazole (PROTONIX) 40 MG tablet Take 1 tablet (40 mg total) by mouth daily. (Patient taking differently: Take 40 mg by mouth in the morning.)   Probiotic Product (PROBIOTIC PO) Take 1 capsule by mouth in the morning.   rosuvastatin (CRESTOR) 5 MG tablet Take 1 tablet (5 mg total) by mouth daily. (Patient taking differently: Take 5 mg by mouth daily after lunch.)   Current Facility-Administered Medications for the 10/20/21 encounter (Office Visit) with [name], PA-C  Medication   sodium chloride flush (NS) 0.9 % injection 3 mL     Allergies:   Patient has no known allergies.   Social History   Socioeconomic History   Marital status: Widowed    Spouse name: Not on file   Number of children: Not on file   Years of education: Not on file   Highest education level: Not on file  Occupational History   Not on file  Tobacco Use   Smoking status: Never   Smokeless tobacco: Never  Vaping Use   Vaping Use: Never used  Substance and Sexual Activity   Alcohol use: Not Currently   Drug use: Not Currently   Sexual activity: Not on file  Other Topics Concern   Not on file  Social History Narrative   Not on file   Social Determinants of Health   Financial Resource Strain: Not on file  Food Insecurity: Not on file  Transportation Needs: Not on file  Physical Activity: Not on file  Stress: Not on file  Social Connections: Not on file     Family History:  The patient's  family history includes Hypertension in his father and mother.   ROS:   Please see the history of present illness.    ROS All other systems reviewed and are negative.   PHYSICAL EXAM:   VS:  BP (!) 110/56   Pulse 72   Ht 5\' 10"  (1.778 m)   Wt 239 lb (108.4 kg)   SpO2 96%   BMI 34.29 kg/m   Physical Exam  GEN: Obese, in no acute distress  Neck: no JVD, carotid bruits, or masses Cardiac:RRR; 2/6 systolic murmur Respiratory:   clear to auscultation bilaterally, normal work of breathing GI: soft, nontender, nondistended, + BS Ext: without cyanosis, clubbing, or edema, Good distal pulses bilaterally Neuro:  Alert and Oriented x 3, Strength and sensation are intact Psych: euthymic mood, full affect  Wt Readings from Last 3 Encounters:  10/20/21 239 lb (108.4 kg)  09/29/21 244 lb (110.7 kg)  02/27/21 241 lb (109.3 kg)      Studies/Labs Reviewed:   EKG:  EKG is not ordered today.   Recent Labs: 02/26/2021: ALT 28; BUN 22; Creatinine, Ser  1.16; Hemoglobin 14.7; Platelets 188; Potassium 4.8; Sodium 133   Lipid Panel    Component Value Date/Time   CHOL 98 06/28/2020 1035   TRIG 101 06/28/2020 1035   HDL 29 (L) 06/28/2020 1035   CHOLHDL 3.4 06/28/2020 1035   VLDL 20 06/28/2020 1035   LDLCALC 49 06/28/2020 1035    Additional studies/ records that were reviewed today include:    Echocardiogram 06/28/2020:  1. Left ventricular ejection fraction, by estimation, is 55 to 60%. The  left ventricle has normal function. The left ventricle has no regional  wall motion abnormalities. There is mild left ventricular hypertrophy.  Left ventricular diastolic parameters  were normal.   2. Right ventricular systolic function is normal. The right ventricular  size is normal. Tricuspid regurgitation signal is inadequate for assessing  PA pressure.   3. The mitral valve is grossly normal. Trivial mitral valve  regurgitation.   4. The aortic valve is tricuspid. Aortic valve regurgitation is not  visualized.   5. The inferior vena cava is normal in size with greater than 50%  respiratory variability, suggesting right atrial pressure of 3 mmHg.    Cardiac catheterization 07/25/2020: Prox LAD to Mid LAD lesion is 50% stenosed.   IMPRESSION: Mr. Bernasconi has an intermediate fairly long proximal LAD lesion with a borderline significant RFR on minimal antianginals.  He required a fairly long stent and a relatively smallish  caliber LAD beginning near ostial.  I recommend optimizing antianginal medications and recommend intervention for recalcitrant symptoms.    Risk Assessment/Calculations:         ASSESSMENT:    1. Coronary artery disease involving native coronary artery of native heart without angina pectoris   2. Essential hypertension   3. Type 2 diabetes mellitus without complication, without long-term current use of insulin (HCC)   4. Hyperlipidemia, unspecified hyperlipidemia type      PLAN:  In order of problems listed above:    Chest pain with nonobstructive CAD on cath  50% proximal to mid LAD with borderline significant RFR.  Plan for increase antianginals and intervention only for recalcitrant symptoms.  Patient has had an increase in chest pressure which has improved with the addition of Imdur 30 mg once daily. He will call if increase in symptoms.   HTN blood pressure controlled   DM2 A1c  was >7 recently. just started on Jardiance   Hyperlipidemia LDL 39 09/2021  Shared Decision Making/Informed Consent        Medication Adjustments/Labs and Tests Ordered: Current medicines are reviewed at length with the patient today.  Concerns regarding medicines are outlined above.  Medication changes, Labs and Tests ordered today are listed in the Patient Instructions below. Patient Instructions  Medication Instructions:  Your physician recommends that you continue on your current medications as directed. Please refer to the Current Medication list given to you today.   Labwork: None today  Testing/Procedures: None today  Follow-Up: Keep 12/21 apt with Dr.McDowell   Call us if you have any worsening symptoms.  Any Other Special Instructions Will Be Listed Below (If Applicable).  If you need a refill on your cardiac medications before your next appointment, please call your pharmacy.    Elson Clan, PA-C  10/20/2021 11:02 AM    [facility] [address] [place], [place], [place]  [address] Phone: [phone]; Fax: [phone]

## 2021-10-20 ENCOUNTER — Ambulatory Visit (INDEPENDENT_AMBULATORY_CARE_PROVIDER_SITE_OTHER): Payer: Medicare Other | Admitting: Physician Assistant

## 2021-10-20 ENCOUNTER — Encounter: Payer: Self-pay | Admitting: Physician Assistant

## 2021-10-20 VITALS — BP 110/56 | HR 72 | Ht 70.0 in | Wt 239.0 lb

## 2021-10-20 DIAGNOSIS — I2510 Atherosclerotic heart disease of native coronary artery without angina pectoris: Secondary | ICD-10-CM | POA: Diagnosis not present

## 2021-10-20 DIAGNOSIS — I10 Essential (primary) hypertension: Secondary | ICD-10-CM

## 2021-10-20 DIAGNOSIS — E119 Type 2 diabetes mellitus without complications: Secondary | ICD-10-CM

## 2021-10-20 DIAGNOSIS — E785 Hyperlipidemia, unspecified: Secondary | ICD-10-CM

## 2021-10-20 NOTE — Patient Instructions (Signed)
Medication Instructions:  Your physician recommends that you continue on your current medications as directed. Please refer to the Current Medication list given to you today.   Labwork: None today  Testing/Procedures: None today  Follow-Up: Keep 12/21 apt with Dr.McDowell   Call us if you have any worsening symptoms.  Any Other Special Instructions Will Be Listed Below (If Applicable).  If you need a refill on your cardiac medications before your next appointment, please call your pharmacy.

## 2021-11-23 ENCOUNTER — Other Ambulatory Visit: Payer: Self-pay | Admitting: Cardiology

## 2021-12-19 ENCOUNTER — Other Ambulatory Visit: Payer: Self-pay | Admitting: Physician Assistant

## 2022-01-31 ENCOUNTER — Other Ambulatory Visit: Payer: Self-pay | Admitting: Cardiology

## 2022-02-26 ENCOUNTER — Encounter: Payer: Self-pay | Admitting: Cardiology

## 2022-02-26 ENCOUNTER — Ambulatory Visit: Payer: Medicare Other | Attending: Cardiology | Admitting: Cardiology

## 2022-02-26 VITALS — BP 128/60 | HR 80 | Ht 70.0 in | Wt 234.2 lb

## 2022-02-26 DIAGNOSIS — I25119 Atherosclerotic heart disease of native coronary artery with unspecified angina pectoris: Secondary | ICD-10-CM

## 2022-02-26 DIAGNOSIS — I10 Essential (primary) hypertension: Secondary | ICD-10-CM

## 2022-02-26 DIAGNOSIS — E782 Mixed hyperlipidemia: Secondary | ICD-10-CM | POA: Diagnosis present

## 2022-02-26 NOTE — Progress Notes (Signed)
Cardiology Office Note  Date: 02/26/2022   ID: Bradley Acevedo, DOB May 20, 1945, MRN [id number]  PCP:  Shawnie Dapper, PA-C  Cardiologist:  Nona Dell, MD Electrophysiologist:  None   Chief Complaint  Patient presents with   Cardiac follow-up    History of Present Illness: Bradley Acevedo is a 76 y.o. male last seen in August by Ms. Bradley Acevedo, I reviewed the note.  He is here for a routine visit.  States that he is doing very well, no chest pain on current regimen, no change in stamina.  We went over his medications today.  He was not able to afford Jardiance and discontinued it.  Otherwise his regimen has been stable.  Lipids are very well-controlled with LDL 39 in July on Crestor 5 mg daily.  Past Medical History:  Diagnosis Date   Acid reflux    CAD (coronary artery disease)    Moderate LAD disease May 2022 managed medically   Diverticulosis    Hypertension    Hypothyroid    IBS (irritable bowel syndrome)    Type 2 diabetes mellitus [facility])     Past Surgical History:  Procedure Laterality Date   CIRCUMCISION     COLONOSCOPY     COLONOSCOPY  06/25/2016   [place], [place] by [name]   INTRAVASCULAR PRESSURE WIRE/FFR STUDY N/A 07/25/2020   Procedure: INTRAVASCULAR PRESSURE WIRE/FFR STUDY;  Surgeon: [name], MD;  Location: [facility];  Service: Cardiovascular;  Laterality: N/A;   LEFT HEART CATH AND CORONARY ANGIOGRAPHY N/A 07/25/2020   Procedure: LEFT HEART CATH AND CORONARY ANGIOGRAPHY;  Surgeon: [name], MD;  Location: [facility] INVAS[facility] CV [facility];  Service: Cardiovascular;  Laterality: N/A;   meniscus left knee     MENISCUS REPAIR Left 05/28/2017   [place], [place] by Dr. [name]   TONSILLECTOMY  1953   [place], [place]    Current Outpatient Medications  Medication Sig Dispense Refill   amLODipine (NORVASC) 5 MG tablet TAKE 1/2 TABLET BY MOUTH EVERY DAY 45 tablet 3   aspirin EC 81 MG EC tablet Take 1 tablet (81 mg total) by mouth daily. Swallow  whole. (Patient taking differently: Take 81 mg by mouth in the morning. Swallow whole.) 30 tablet 11   Cholecalciferol (D3 DOTS) 50 MCG (2000 UT) TBDP Take 2,000 Units by mouth in the morning.     hyoscyamine (ANASPAZ) 0.125 MG TBDP disintergrating tablet Take 0.125-0.25 mg by mouth every 4 (four) hours as needed for bladder spasms or cramping.     isosorbide mononitrate (IMDUR) 30 MG 24 hr tablet TAKE 1 TABLET BY MOUTH EVERY DAY 90 tablet 2   levothyroxine (SYNTHROID) 50 MCG tablet Take 50 mcg by mouth daily before breakfast.     lisinopril (ZESTRIL) 10 MG tablet TAKE 1 TABLET EVERY MORNING 90 tablet 3   metFORMIN (GLUCOPHAGE) 500 MG tablet Take 500 mg by mouth in the morning.     metoprolol succinate (TOPROL-XL) 50 MG 24 hr tablet TAKE 1 TABLET BY MOUTH DAILY. TAKE WITH OR IMMEDIATELY FOLLOWING A MEAL. 90 tablet 3   nitroGLYCERIN (NITROSTAT) 0.4 MG SL tablet Place 1 tablet (0.4 mg total) under the tongue every 5 (five) minutes x 3 doses as needed for chest pain. 25 tablet 3   ondansetron (ZOFRAN-ODT) 4 MG disintegrating tablet Take 4 mg by mouth every 8 (eight) hours as needed for nausea or vomiting.     Probiotic Product (PROBIOTIC PO) Take 1 capsule by mouth in the  morning.     rosuvastatin (CRESTOR) 5 MG tablet Take 1 tablet (5 mg total) by mouth daily. (Patient taking differently: Take 5 mg by mouth daily after lunch.) 90 tablet 3   Current Facility-Administered Medications  Medication Dose Route Frequency Provider Last Rate Last Admin   sodium chloride flush (NS) 0.9 % injection 3 mL  3 mL Intravenous Q12H Jonelle Sidle, MD       Allergies:  Patient has no known allergies.   ROS: No palpitations or syncope.  Physical Exam: VS:  BP 128/60   Pulse 80   Ht 5\' 10"  (1.778 m)   Wt 234 lb 3.2 oz (106.2 kg)   SpO2 96%   BMI 33.60 kg/m , BMI Body mass index is 33.6 kg/m.  Wt Readings from Last 3 Encounters:  02/26/22 234 lb 3.2 oz (106.2 kg)  10/20/21 239 lb (108.4 kg)  09/29/21  244 lb (110.7 kg)    General: Patient appears comfortable at rest. HEENT: Conjunctiva and lids normal. Neck: Supple, no elevated JVP or carotid bruits. Lungs: Clear to auscultation, nonlabored breathing at rest. Cardiac: Regular rate and rhythm, no S3 or significant systolic murmur. Extremities: No pitting edema.  ECG:  An ECG dated 09/29/2021 was personally reviewed today and demonstrated:  Sinus rhythm.  Recent Labwork:    Component Value Date/Time   CHOL 98 06/28/2020 1035   TRIG 101 06/28/2020 1035   HDL 29 (L) 06/28/2020 1035   CHOLHDL 3.4 06/28/2020 1035   VLDL 20 06/28/2020 1035   LDLCALC 49 06/28/2020 1035  July 2023: BUN 17, creatinine 1.08, potassium 4.9, AST 16, ALT 22, cholesterol 89, triglycerides 96, HDL 31, LDL 39, hemoglobin 14.1, platelets 176  Other Studies Reviewed Today:  Echocardiogram 06/28/2020:  1. Left ventricular ejection fraction, by estimation, is 55 to 60%. The  left ventricle has normal function. The left ventricle has no regional  wall motion abnormalities. There is mild left ventricular hypertrophy.  Left ventricular diastolic parameters  were normal.   2. Right ventricular systolic function is normal. The right ventricular  size is normal. Tricuspid regurgitation signal is inadequate for assessing  PA pressure.   3. The mitral valve is grossly normal. Trivial mitral valve  regurgitation.   4. The aortic valve is tricuspid. Aortic valve regurgitation is not  visualized.   5. The inferior vena cava is normal in size with greater than 50%  respiratory variability, suggesting right atrial pressure of 3 mmHg.    Cardiac catheterization 07/25/2020: Prox LAD to Mid LAD lesion is 50% stenosed.   IMPRESSION: Bradley Acevedo has an intermediate fairly long proximal LAD lesion with a borderline significant RFR on minimal antianginals.  He required a fairly long stent and a relatively smallish caliber LAD beginning near ostial.  I recommend optimizing  antianginal medications and recommend intervention for recalcitrant symptoms.  Assessment and Plan:  1.  Nonobstructive CAD with moderate proximal to mid LAD stenosis with borderline RFR significance and plan for medical therapy.  He does not describe any angina following addition of Imdur.  Continue aspirin, Norvasc, lisinopril, Toprol-XL, and Crestor otherwise.  2.  Mixed hyperlipidemia on low-dose Crestor with LDL 39.  3.  Essential hypertension, blood pressure is adequately controlled today on current regimen.  No changes were made.  Medication Adjustments/Labs and Tests Ordered: Current medicines are reviewed at length with the patient today.  Concerns regarding medicines are outlined above.   Tests Ordered: No orders of the defined types were placed in this  encounter.   Medication Changes: No orders of the defined types were placed in this encounter.   Disposition:  Follow up  6 months.  Signed, Jonelle Sidle, MD, [facility] 02/26/2022 1:18 PM    [facility] [address], [place], [place] [address] Phone: [phone]; Fax: [phone]

## 2022-02-26 NOTE — Patient Instructions (Signed)
Medication Instructions:  Your physician recommends that you continue on your current medications as directed. Please refer to the Current Medication list given to you today.   Labwork: None today  Testing/Procedures: None today  Follow-Up: 6 months  Any Other Special Instructions Will Be Listed Below (If Applicable).  If you need a refill on your cardiac medications before your next appointment, please call your pharmacy.  

## 2022-03-18 ENCOUNTER — Ambulatory Visit (INDEPENDENT_AMBULATORY_CARE_PROVIDER_SITE_OTHER): Payer: Medicare Other | Admitting: Family Medicine

## 2022-03-18 ENCOUNTER — Encounter: Payer: Self-pay | Admitting: Family Medicine

## 2022-03-18 VITALS — BP 130/76 | HR 78 | Ht 70.0 in | Wt 248.0 lb

## 2022-03-18 DIAGNOSIS — I10 Essential (primary) hypertension: Secondary | ICD-10-CM

## 2022-03-18 DIAGNOSIS — Z1159 Encounter for screening for other viral diseases: Secondary | ICD-10-CM

## 2022-03-18 DIAGNOSIS — Z1321 Encounter for screening for nutritional disorder: Secondary | ICD-10-CM

## 2022-03-18 DIAGNOSIS — K219 Gastro-esophageal reflux disease without esophagitis: Secondary | ICD-10-CM | POA: Insufficient documentation

## 2022-03-18 DIAGNOSIS — E669 Obesity, unspecified: Secondary | ICD-10-CM | POA: Insufficient documentation

## 2022-03-18 DIAGNOSIS — E785 Hyperlipidemia, unspecified: Secondary | ICD-10-CM | POA: Insufficient documentation

## 2022-03-18 DIAGNOSIS — Z0189 Encounter for other specified special examinations: Secondary | ICD-10-CM | POA: Insufficient documentation

## 2022-03-18 DIAGNOSIS — R7301 Impaired fasting glucose: Secondary | ICD-10-CM

## 2022-03-18 DIAGNOSIS — Z114 Encounter for screening for human immunodeficiency virus [HIV]: Secondary | ICD-10-CM

## 2022-03-18 DIAGNOSIS — E039 Hypothyroidism, unspecified: Secondary | ICD-10-CM

## 2022-03-18 DIAGNOSIS — K635 Polyp of colon: Secondary | ICD-10-CM | POA: Insufficient documentation

## 2022-03-18 DIAGNOSIS — E119 Type 2 diabetes mellitus without complications: Secondary | ICD-10-CM | POA: Insufficient documentation

## 2022-03-18 DIAGNOSIS — I2510 Atherosclerotic heart disease of native coronary artery without angina pectoris: Secondary | ICD-10-CM | POA: Insufficient documentation

## 2022-03-18 DIAGNOSIS — H903 Sensorineural hearing loss, bilateral: Secondary | ICD-10-CM | POA: Insufficient documentation

## 2022-03-18 DIAGNOSIS — K760 Fatty (change of) liver, not elsewhere classified: Secondary | ICD-10-CM | POA: Insufficient documentation

## 2022-03-18 DIAGNOSIS — H6122 Impacted cerumen, left ear: Secondary | ICD-10-CM | POA: Insufficient documentation

## 2022-03-18 DIAGNOSIS — Z86010 Personal history of colonic polyps: Secondary | ICD-10-CM | POA: Insufficient documentation

## 2022-03-18 DIAGNOSIS — Z7189 Other specified counseling: Secondary | ICD-10-CM | POA: Insufficient documentation

## 2022-03-18 MED ORDER — LEVOTHYROXINE SODIUM 50 MCG PO TABS: 50.0000 ug | ORAL_TABLET | Freq: Every day | ORAL | 2 refills | Status: DC

## 2022-03-18 MED ORDER — AMLODIPINE BESYLATE 5 MG PO TABS: 2.5000 mg | ORAL_TABLET | Freq: Every day | ORAL | 2 refills | Status: DC

## 2022-03-18 MED ORDER — LISINOPRIL 10 MG PO TABS: 10.0000 mg | ORAL_TABLET | Freq: Every morning | ORAL | 3 refills | Status: DC

## 2022-03-18 MED ORDER — METFORMIN HCL 500 MG PO TABS: 500.0000 mg | ORAL_TABLET | Freq: Every morning | ORAL | 2 refills | Status: DC

## 2022-03-18 MED ORDER — METOPROLOL SUCCINATE ER 50 MG PO TB24: 50.0000 mg | ORAL_TABLET | Freq: Every day | ORAL | 2 refills | Status: DC

## 2022-03-18 NOTE — Progress Notes (Signed)
New Patient Office Visit  Subjective:     Patient ID: Bradley Acevedo, male    DOB: November 18, 1945, 77 y.o.   MRN: [id number]  Chief Complaint  Patient presents with   Establish Care   Diabetes   Hypertension   Hyperlipidemia   Hypothyroidism    Bradley Acevedo 77 year old male with past medical history diabetes, hyperlipidemia,obesity, HTN,CAD, hypothyroid. Patient here for follow-up of hypertension management. He is exercising and is not adherent to low salt diet. Blood pressure is well controlled at home.Patient denies chest painchest pressure/discomfort, dyspnea, orthopnea, and tachypnea. Cardiovascular risk factors: diabetes mellitus, male gender, and obesity (BMI >= 30 kg/m2).   Hypertension This is a chronic problem. The Acevedo episode started more than 1 year ago. The problem is controlled. Pertinent negatives include no blurred vision, orthopnea, shortness of breath or sweats. Risk factors for coronary artery disease include diabetes mellitus, male gender and obesity. Past treatments include ACE inhibitors, calcium channel blockers and beta blockers. The Acevedo treatment provides significant improvement. Compliance problems include diet.   Diabetes He has type 2 diabetes mellitus. Pertinent negatives for hypoglycemia include no seizures, sweats or tremors. Pertinent negatives for diabetes include no blurred vision and no weakness. Pertinent negatives for hypoglycemia complications include no blackouts. Symptoms are stable. Pertinent negatives for diabetic complications include no autonomic neuropathy, nephropathy or peripheral neuropathy. Risk factors for coronary artery disease include male sex, obesity, diabetes mellitus and hypertension. Acevedo diabetic treatment includes oral agent (monotherapy) and diet. He is compliant with treatment most of the time.  Head Injury  The incident occurred 5 to 7 days ago. The injury mechanism was a fall. There was no loss of consciousness. There  was no blood loss. The quality of the pain is described as aching. The pain is at a severity of 3/10. The pain is mild. The pain has been intermittent since the injury. Pertinent negatives include no blurred vision, vomiting or weakness. He has tried NSAIDs and acetaminophen for the symptoms. The treatment provided significant relief.     Review of Systems  Eyes:  Negative for blurred vision.  Respiratory:  Negative for shortness of breath.   Cardiovascular:  Negative for orthopnea.  Gastrointestinal:  Negative for vomiting.  Neurological:  Negative for tremors, seizures and weakness.        Objective:    BP 130/76   Pulse 78   Ht 5\' 10"  (1.778 m)   Wt 248 lb (112.5 kg)   SpO2 98%   BMI 35.58 kg/m  BP Readings from Last 3 Encounters:  03/18/22 130/76  02/26/22 128/60  10/20/21 (!) 110/56      Physical Exam Constitutional:      Appearance: Normal appearance.  HENT:     Right Ear: Tympanic membrane normal.     Left Ear: Tympanic membrane normal.     Nose: Nose normal.  Eyes:     Pupils: Pupils are equal, round, and reactive to light.  Cardiovascular:     Rate and Rhythm: Normal rate and regular rhythm.     Pulses: Normal pulses.     Heart sounds: Normal heart sounds.  Pulmonary:     Effort: Pulmonary effort is normal. No respiratory distress.     Breath sounds: Normal breath sounds.  Abdominal:     General: Bowel sounds are normal. There is no distension.     Palpations: Abdomen is soft. There is no mass.  Musculoskeletal:        General: No  swelling. Normal range of motion.     Cervical back: Normal range of motion.     Right lower leg: No edema.     Left lower leg: No edema.  Skin:    General: Skin is warm.     Capillary Refill: Capillary refill takes less than 2 seconds.  Neurological:     General: No focal deficit present.     Mental Status: He is alert.  Psychiatric:        Mood and Affect: Mood normal.     No results found for any visits on  03/18/22.      Assessment & Plan:   Problem List Items Addressed This Visit       Cardiovascular and Mediastinum   Essential hypertension    Patient Blood pressure well controlled. Labs ordered Explain the importance of following a Dash Diet and exercise routine. Refilled amlodipine 5 mg Lisinopril 10 mg Metoprolol succinate 50mg  Follow up the next 3 months for HTN managment      Relevant Medications   amLODipine (NORVASC) 5 MG tablet   lisinopril (ZESTRIL) 10 MG tablet   metoprolol succinate (TOPROL-XL) 50 MG 24 hr tablet     Endocrine   Hypothyroidism   Relevant Medications   levothyroxine (SYNTHROID) 50 MCG tablet   metoprolol succinate (TOPROL-XL) 50 MG 24 hr tablet   Other Relevant Orders   TSH + free T4   Ambulatory referral to Endocrinology     Other   Hyperlipidemia   Relevant Medications   amLODipine (NORVASC) 5 MG tablet   lisinopril (ZESTRIL) 10 MG tablet   metoprolol succinate (TOPROL-XL) 50 MG 24 hr tablet   Other Relevant Orders   Lipid panel   Other Visit Diagnoses     Primary hypertension    -  Primary   Relevant Medications   amLODipine (NORVASC) 5 MG tablet   lisinopril (ZESTRIL) 10 MG tablet   metoprolol succinate (TOPROL-XL) 50 MG 24 hr tablet   Other Relevant Orders   Microalbumin / creatinine urine ratio   CMP14+EGFR   CBC with Differential/Platelet   Need for hepatitis C screening test       Relevant Orders   Hepatitis C antibody   Encounter for vitamin deficiency screening       Screening for HIV (human immunodeficiency virus)       IFG (impaired fasting glucose)       Relevant Orders   Hemoglobin A1c       Meds ordered this encounter  Medications   amLODipine (NORVASC) 5 MG tablet    Sig: Take 0.5 tablets (2.5 mg total) by mouth daily.    Dispense:  45 tablet    Refill:  2   levothyroxine (SYNTHROID) 50 MCG tablet    Sig: Take 1 tablet (50 mcg total) by mouth daily before breakfast.    Dispense:  30 tablet    Refill:   2   lisinopril (ZESTRIL) 10 MG tablet    Sig: Take 1 tablet (10 mg total) by mouth every morning.    Dispense:  90 tablet    Refill:  3   metFORMIN (GLUCOPHAGE) 500 MG tablet    Sig: Take 1 tablet (500 mg total) by mouth in the morning.    Dispense:  30 tablet    Refill:  2   metoprolol succinate (TOPROL-XL) 50 MG 24 hr tablet    Sig: Take 1 tablet (50 mg total) by mouth daily. TAKE WITH OR IMMEDIATELY FOLLOWING A MEAL.  Dispense:  90 tablet    Refill:  2    No follow-ups on file.  Cruzita Lederer Newman Nip, FNP

## 2022-03-18 NOTE — Assessment & Plan Note (Addendum)
Patient Blood pressure well controlled. Labs ordered Explain the importance of following a Dash Diet and exercise routine. Refilled amlodipine 5 mg Lisinopril 10 mg Metoprolol succinate 50mg  Follow up the next 3 months for HTN managment

## 2022-03-18 NOTE — Patient Instructions (Signed)
Please follow up in the next 3 months for Chronic conditions.

## 2022-03-21 LAB — CBC WITH DIFFERENTIAL/PLATELET
Basophils Absolute: 0.1 10*3/uL (ref 0.0–0.2)
Basos: 1 %
EOS (ABSOLUTE): 0.1 10*3/uL (ref 0.0–0.4)
Eos: 2 %
Hematocrit: 43.9 % (ref 37.5–51.0)
Hemoglobin: 14.6 g/dL (ref 13.0–17.7)
Immature Grans (Abs): 0 10*3/uL (ref 0.0–0.1)
Immature Granulocytes: 1 %
Lymphocytes Absolute: 1.6 10*3/uL (ref 0.7–3.1)
Lymphs: 29 %
MCH: 30.5 pg (ref 26.6–33.0)
MCHC: 33.3 g/dL (ref 31.5–35.7)
MCV: 92 fL (ref 79–97)
Monocytes Absolute: 0.4 10*3/uL (ref 0.1–0.9)
Monocytes: 7 %
Neutrophils Absolute: 3.5 10*3/uL (ref 1.4–7.0)
Neutrophils: 60 %
Platelets: 187 10*3/uL (ref 150–450)
RBC: 4.79 x10E6/uL (ref 4.14–5.80)
RDW: 13.7 % (ref 11.6–15.4)
WBC: 5.8 10*3/uL (ref 3.4–10.8)

## 2022-03-21 LAB — CMP14+EGFR
ALT: 38 IU/L (ref 0–44)
AST: 25 IU/L (ref 0–40)
Albumin/Globulin Ratio: 2.1 (ref 1.2–2.2)
Albumin: 4.5 g/dL (ref 3.8–4.8)
Alkaline Phosphatase: 68 IU/L (ref 44–121)
BUN/Creatinine Ratio: 17 (ref 10–24)
BUN: 17 mg/dL (ref 8–27)
Bilirubin Total: 0.6 mg/dL (ref 0.0–1.2)
CO2: 22 mmol/L (ref 20–29)
Calcium: 9.5 mg/dL (ref 8.6–10.2)
Chloride: 104 mmol/L (ref 96–106)
Creatinine, Ser: 1.03 mg/dL (ref 0.76–1.27)
Globulin, Total: 2.1 g/dL (ref 1.5–4.5)
Glucose: 209 mg/dL — ABNORMAL HIGH (ref 70–99)
Potassium: 4.8 mmol/L (ref 3.5–5.2)
Sodium: 134 mmol/L (ref 134–144)
Total Protein: 6.6 g/dL (ref 6.0–8.5)
eGFR: 76 mL/min/{1.73_m2} (ref >59)

## 2022-03-21 LAB — LIPID PANEL
Chol/HDL Ratio: 3.2 ratio (ref 0.0–5.0)
Cholesterol, Total: 104 mg/dL (ref 100–199)
HDL: 33 mg/dL — ABNORMAL LOW (ref >39)
LDL Chol Calc (NIH): 47 mg/dL (ref 0–99)
Triglycerides: 135 mg/dL (ref 0–149)
VLDL Cholesterol Cal: 24 mg/dL (ref 5–40)

## 2022-03-21 LAB — TSH+FREE T4
Free T4: 1.13 ng/dL (ref 0.82–1.77)
TSH: 5.44 u[IU]/mL — ABNORMAL HIGH (ref 0.450–4.500)

## 2022-03-21 LAB — MICROALBUMIN / CREATININE URINE RATIO
Creatinine, Urine: 51.8 mg/dL
Microalb/Creat Ratio: 15 mg/g creat (ref 0–29)
Microalbumin, Urine: 7.6 ug/mL

## 2022-03-21 LAB — HEMOGLOBIN A1C
Est. average glucose Bld gHb Est-mCnc: 209 mg/dL
Hgb A1c MFr Bld: 8.9 % — ABNORMAL HIGH (ref 4.8–5.6)

## 2022-03-21 LAB — HEPATITIS C ANTIBODY: Hep C Virus Ab: NONREACTIVE

## 2022-03-27 ENCOUNTER — Other Ambulatory Visit: Payer: Self-pay | Admitting: Family Medicine

## 2022-03-27 MED ORDER — METFORMIN HCL 1000 MG PO TABS: 1000.0000 mg | ORAL_TABLET | Freq: Two times a day (BID) | ORAL | 3 refills | Status: DC

## 2022-04-08 ENCOUNTER — Telehealth: Payer: Self-pay | Admitting: Cardiology

## 2022-04-08 MED ORDER — ISOSORBIDE MONONITRATE ER 30 MG PO TB24: 30.0000 mg | ORAL_TABLET | Freq: Two times a day (BID) | ORAL | 3 refills | Status: DC

## 2022-04-08 NOTE — Telephone Encounter (Signed)
Pt notified of medication change to Imdur 30 mg Two Times Daily. Order placed.

## 2022-04-08 NOTE — Telephone Encounter (Signed)
Spoke with pt and instructed to take another nitro at this time. Pt voiced understanding of Dr. Myles Gip response.

## 2022-04-08 NOTE — Telephone Encounter (Signed)
Pt c/o of Chest Pain: STAT if CP now or developed within 24 hours  1. Are you having CP right now? Yes   2. Are you experiencing any other symptoms (ex. SOB, nausea, vomiting, sweating)? Left arm pain   3. How long have you been experiencing CP? Started this morning around 7:00 am   4. Is your CP continuous or coming and going? Continuous   5. Have you taken Nitroglycerin? Yes, took one around 7:30 am    Transferring STAT call to a nurse. ?

## 2022-04-08 NOTE — Telephone Encounter (Signed)
Chest pressure relieved with second nitro.

## 2022-04-08 NOTE — Telephone Encounter (Signed)
Pt reports that this morning around 7 he started to feel chest pressure. Pt states that he is not having pain it feels more like "weight" on his chest. Pt denies n/v SOB. Pt states that his left arm was hurting but stopped after taking Nitro. Rates left arm pain 4/10 when he was having it. No arm pain at this time. Pt states that last time this happen he was given medication and told that it may need to be increased. Please advise.

## 2022-05-06 ENCOUNTER — Ambulatory Visit: Payer: Medicare Other | Admitting: "Endocrinology

## 2022-05-08 ENCOUNTER — Encounter: Payer: Self-pay | Admitting: "Endocrinology

## 2022-05-19 ENCOUNTER — Encounter: Payer: Self-pay | Admitting: "Endocrinology

## 2022-05-19 ENCOUNTER — Ambulatory Visit (INDEPENDENT_AMBULATORY_CARE_PROVIDER_SITE_OTHER): Payer: Medicare Other | Admitting: "Endocrinology

## 2022-05-19 VITALS — BP 128/76 | HR 68 | Ht 70.0 in | Wt 242.4 lb

## 2022-05-19 DIAGNOSIS — E039 Hypothyroidism, unspecified: Secondary | ICD-10-CM

## 2022-05-19 MED ORDER — LEVOTHYROXINE SODIUM 75 MCG PO TABS: 75.0000 ug | ORAL_TABLET | Freq: Every day | ORAL | 1 refills | Status: DC

## 2022-05-19 NOTE — Progress Notes (Signed)
Endocrinology Consult Note                                            05/19/2022, 2:44 PM   Subjective:    Patient ID: Bradley Acevedo, male    DOB: 1945-10-29, PCP Del Eli Hose, FNP   Past Medical History:  Diagnosis Date   Acid reflux    CAD (coronary artery disease)    Moderate LAD disease May 2022 managed medically   Diverticulosis    Hypertension    Hypothyroid    IBS (irritable bowel syndrome)    Type 2 diabetes mellitus [facility])    Past Surgical History:  Procedure Laterality Date   CIRCUMCISION     COLONOSCOPY     COLONOSCOPY  06/25/2016   [place], [place] by Dr. Malen Gauze   INTRAVASCULAR PRESSURE WIRE/FFR STUDY N/A 07/25/2020   Procedure: INTRAVASCULAR PRESSURE WIRE/FFR STUDY;  Surgeon: Lorretta Harp, MD;  Location: [place] [facility];  Service: Cardiovascular;  Laterality: N/A;   LEFT HEART CATH AND CORONARY ANGIOGRAPHY N/A 07/25/2020   Procedure: LEFT HEART CATH AND CORONARY ANGIOGRAPHY;  Surgeon: Lorretta Harp, MD;  Location: [place] [facility];  Service: Cardiovascular;  Laterality: N/A;   meniscus left knee     MENISCUS REPAIR Left 05/28/2017   [place], [place] by Dr. Alroy Dust   TONSILLECTOMY  1953   [place], [place]   Social History   Socioeconomic History   Marital status: Married    Spouse name: Not on file   Number of children: Not on file   Years of education: Not on file   Highest education level: Not on file  Occupational History   Not on file  Tobacco Use   Smoking status: Never   Smokeless tobacco: Never  Vaping Use   Vaping Use: Never used  Substance and Sexual Activity   Alcohol use: Yes    Comment: rarely   Drug use: Not Currently   Sexual activity: Not on file  Other Topics Concern   Not on file  Social History Narrative   Not on file   Social Determinants of Health   Financial Resource Strain: Not on file  Food Insecurity: Not on file  Transportation Needs: Not on file  Physical Activity: Not on file  Stress:  Not on file  Social Connections: Not on file   Family History  Problem Relation Age of Onset   Cancer Mother    Hypertension Mother    Hyperlipidemia Mother    Hyperlipidemia Father    Hypertension Father    Outpatient Encounter Medications as of 05/19/2022  Medication Sig   isosorbide mononitrate (IMDUR) 30 MG 24 hr tablet Take 1 tablet (30 mg total) by mouth 2 (two) times daily.   pantoprazole (PROTONIX) 40 MG tablet Take 40 mg by mouth daily.   amLODipine (NORVASC) 5 MG tablet Take 0.5 tablets (2.5 mg total) by mouth daily.   aspirin EC 81 MG EC tablet Take 1 tablet (81 mg total) by mouth daily. Swallow whole. (Patient taking differently: Take 81 mg by mouth in the morning. Swallow whole.)   Cholecalciferol (D3 DOTS) 50 MCG (2000 UT) TBDP Take 2,000 Units by mouth in the morning.   levothyroxine (SYNTHROID) 75 MCG tablet Take 1 tablet (75 mcg total) by mouth daily before breakfast.   lisinopril (ZESTRIL) 10 MG tablet  Take 1 tablet (10 mg total) by mouth every morning.   metFORMIN (GLUCOPHAGE) 1000 MG tablet Take 1 tablet (1,000 mg total) by mouth 2 (two) times daily with a meal.   metoprolol succinate (TOPROL-XL) 50 MG 24 hr tablet Take 1 tablet (50 mg total) by mouth daily. TAKE WITH OR IMMEDIATELY FOLLOWING A MEAL.   nitroGLYCERIN (NITROSTAT) 0.4 MG SL tablet Place 1 tablet (0.4 mg total) under the tongue every 5 (five) minutes x 3 doses as needed for chest pain.   ondansetron (ZOFRAN-ODT) 4 MG disintegrating tablet Take 4 mg by mouth every 8 (eight) hours as needed for nausea or vomiting.   Probiotic Product (PROBIOTIC PO) Take 1 capsule by mouth in the morning.   rosuvastatin (CRESTOR) 5 MG tablet Take 1 tablet (5 mg total) by mouth daily. (Patient taking differently: Take 5 mg by mouth daily after lunch.)   [DISCONTINUED] hyoscyamine (ANASPAZ) 0.125 MG TBDP disintergrating tablet Take 0.125-0.25 mg by mouth every 4 (four) hours as needed for bladder spasms or cramping.    [DISCONTINUED] levothyroxine (SYNTHROID) 50 MCG tablet Take 1 tablet (50 mcg total) by mouth daily before breakfast.   Facility-Administered Encounter Medications as of 05/19/2022  Medication   sodium chloride flush (NS) 0.9 % injection 3 mL   ALLERGIES: No Known Allergies  VACCINATION STATUS: Immunization History  Administered Date(s) Administered   Influenza Split 01/07/2017   Influenza-Unspecified 12/26/2019, 12/02/2020   Moderna Covid-19 Vaccine Bivalent Booster 87yr & up 12/30/2020   Moderna Sars-Covid-2 Vaccination 04/20/2019, 05/18/2019, 01/11/2020, 05/16/2020   Pneumococcal Conjugate-13 07/13/2016   Tdap 02/07/2011, 02/17/2021   Zoster Recombinat (Shingrix) 04/20/2019, 05/18/2019, 07/03/2019, 09/04/2019    HPI Bradley SKELLYN RUDYis 77y.o. male who presents today with a medical history as above. he is being seen in consultation for hypothyroidism requested by Del OEli Hose FNP.   History is obtained directly from the patient and chart review.  He reports that he was diagnosed with hypothyroidism at approximate age of 89  He was given levothyroxine as thyroid hormone replacement.  He does not recall if this dose was adjusted ever.  His most recent thyroid function tests indicate evidence of under replacement.  He reports progressive weight gain.  He does not have acute complaints today. He denies palpitations, tremors, nor heat intolerance.  He denies dysphagia, shortness of breath nor voice change. He denies any family history of thyroid dysfunction or thyroid malignancy. His other medical problems include type 2 diabetes on metformin, hyperlipidemia on Crestor and hypertension on amlodipine, metoprolol, lisinopril.   Review of Systems  Constitutional: no recent weight gain/loss, no fatigue, no subjective hyperthermia, no subjective hypothermia Eyes: no blurry vision, no xerophthalmia ENT: no sore throat, no nodules palpated in throat, no dysphagia/odynophagia, no  hoarseness Cardiovascular: no Chest Pain, no Shortness of Breath, no palpitations, no leg swelling Respiratory: no cough, no shortness of breath Gastrointestinal: no Nausea/Vomiting/Diarhhea Musculoskeletal: no muscle/joint aches Skin: no rashes Neurological: no tremors, no numbness, no tingling, no dizziness Psychiatric: no depression, no anxiety  Objective:       05/19/2022    2:22 PM 03/18/2022   10:50 AM 03/18/2022   10:01 AM  Vitals with BMI  Height '5\' 10"'$   '5\' 10"'$   Weight 242 lbs 6 oz  248 lbs  BMI [id number] Systolic [id number] Diastolic 76 76 69  Pulse 68  78    BP 128/76   Pulse 68   Ht '5\' 10"'$  (1.778 m)  Wt 242 lb 6.4 oz (110 kg)   BMI 34.78 kg/m   Wt Readings from Last 3 Encounters:  05/19/22 242 lb 6.4 oz (110 kg)  03/18/22 248 lb (112.5 kg)  02/26/22 234 lb 3.2 oz (106.2 kg)    Physical Exam  Constitutional:  Body mass index is 34.78 kg/m.,  not in acute distress, normal state of mind Eyes: PERRLA, EOMI, no exophthalmos ENT: moist mucous membranes, no gross thyromegaly, no gross cervical lymphadenopathy Cardiovascular: normal precordial activity, Regular Rate and Rhythm, no Murmur/Rubs/Gallops Respiratory:  adequate breathing efforts, no gross chest deformity, Clear to auscultation bilaterally Gastrointestinal: abdomen soft, Non -tender, No distension, Bowel Sounds present, no gross organomegaly Musculoskeletal: no gross deformities, strength intact in all four extremities Skin: moist, warm, no rashes Neurological: no tremor with outstretched hands, Deep tendon reflexes normal in bilateral lower extremities.  CMP ( most recent) CMP     Component Value Date/Time   NA 134 03/19/2022 0821   K 4.8 03/19/2022 0821   CL 104 03/19/2022 0821   CO2 22 03/19/2022 0821   GLUCOSE 209 (H) 03/19/2022 0821   GLUCOSE 197 (H) 02/26/2021 0746   BUN 17 03/19/2022 0821   CREATININE 1.03 03/19/2022 0821   CALCIUM 9.5 03/19/2022 0821   PROT 6.6 03/19/2022 0821    ALBUMIN 4.5 03/19/2022 0821   AST 25 03/19/2022 0821   ALT 38 03/19/2022 0821   ALKPHOS 68 03/19/2022 0821   BILITOT 0.6 03/19/2022 0821   GFRNONAA >60 02/26/2021 0746     Diabetic Labs (most recent): Lab Results  Component Value Date   HGBA1C 8.9 (H) 03/19/2022   HGBA1C 6.8 (H) 06/28/2020     Lipid Panel ( most recent) Lipid Panel     Component Value Date/Time   CHOL 104 03/19/2022 0821   TRIG 135 03/19/2022 0821   HDL 33 (L) 03/19/2022 0821   CHOLHDL 3.2 03/19/2022 0821   CHOLHDL 3.4 06/28/2020 1035   VLDL 20 06/28/2020 1035   LDLCALC 47 03/19/2022 0821   LABVLDL 24 03/19/2022 0821      Lab Results  Component Value Date   TSH 5.440 (H) 03/19/2022   FREET4 1.13 03/19/2022         Assessment & Plan:   1. Hypothyroidism  - Knute EDYN NORDELL  is being seen at a kind request of Del Ria Comment, Lamar Benes, FNP. - I have reviewed his available thyroid records and clinically evaluated the patient. - Based on these reviews, he has hypothyroidism with evidence of under replacement. He would benefit from a higher dose of levothyroxine.  I discussed and prescribed levothyroxine 75 mcg p.o. daily before breakfast.   - We discussed about the correct intake of his thyroid hormone, on empty stomach at fasting, with water, separated by at least 30 minutes from breakfast and other medications,  and separated by more than 4 hours from calcium, iron, multivitamins, acid reflux medications (PPIs). -Patient is made aware of the fact that thyroid hormone replacement is needed for life, dose to be adjusted by periodic monitoring of thyroid function tests. His next labs will include antithyroid antibodies to diagnose etiology behind hypothyroidism.  In the absence of clinical goiter, he will not need thyroid imaging at this time. He is advised to maintain close follow-up with his PMD for management of diabetes, hypertension and hyperlipidemia. - he is advised to maintain close follow up  with Del Eli Hose, FNP for primary care needs.   - Time spent with the patient:  45 minutes, of which >50% was spent in  counseling him about his hypothyroidism and the rest in obtaining information about his symptoms, reviewing his previous labs/studies ( including abstractions from other facilities),  evaluations, and treatments,  and developing a plan to confirm diagnosis and long term treatment based on the latest standards of care/guidelines; and documenting his care.  Bradley Acevedo participated in the discussions, expressed understanding, and voiced agreement with the above plans.  All questions were answered to his satisfaction. he is encouraged to contact clinic should he have any questions or concerns prior to his return visit.  Follow up plan: Return in about 3 months (around 08/19/2022) for F/U with Pre-visit Labs.   Glade Lloyd, MD [facility] [address] [place], [place] [address] Phone: [phone]  Fax: [phone]     05/19/2022, 2:44 PM  This note was partially dictated with voice recognition software. Similar sounding words can be transcribed inadequately or may not  be corrected upon review.

## 2022-06-10 ENCOUNTER — Ambulatory Visit (INDEPENDENT_AMBULATORY_CARE_PROVIDER_SITE_OTHER): Payer: Medicare Other | Admitting: Family Medicine

## 2022-06-10 ENCOUNTER — Encounter: Payer: Self-pay | Admitting: Family Medicine

## 2022-06-10 VITALS — Ht 70.0 in | Wt 242.0 lb

## 2022-06-10 DIAGNOSIS — Z0000 Encounter for general adult medical examination without abnormal findings: Secondary | ICD-10-CM

## 2022-06-10 DIAGNOSIS — Z1211 Encounter for screening for malignant neoplasm of colon: Secondary | ICD-10-CM

## 2022-06-10 NOTE — Progress Notes (Signed)
Subjective:   Bradley Acevedo is a 77 y.o. male who presents for Medicare Annual/Subsequent preventive examination.  Review of Systems    Patient denies pain, fever, chills, chest pain, palpations ,shortness of breath, blurred vision,cough, abdominal pain, nausea, vomiting, headache, dizziness. Patient is not feeling nervous or anxious. Cardiac Risk Factors include: advanced age (>16men, >62 women)     Objective:    Today's Vitals   06/10/22 0812  Weight: 242 lb (109.8 kg)  Height: 5\' 10"  (1.778 m)   Body mass index is 34.72 kg/m.     06/10/2022    8:17 AM 02/27/2021    6:18 AM 02/26/2021    7:30 AM 07/25/2020    5:52 AM 06/28/2020   12:42 PM 06/28/2020    9:01 AM 01/22/2020    4:44 PM  Advanced Directives  Does Patient Have a Medical Advance Directive? Yes No No Yes No No Yes  Type of Theatre stage manager of [place];Living will   [place]  Does patient want to make changes to medical advance directive? Yes (Inpatient - patient defers changing a medical advance directive at this time - Information given)   No - Patient declined     Copy of [place] in Chart?    No - copy requested     Would patient like information on creating a medical advance directive? Yes (Inpatient - patient defers creating a medical advance directive at this time - Information given) No - Patient declined No - Patient declined  No - Patient declined No - Patient declined     Current Medications (verified) Outpatient Encounter Medications as of 06/10/2022  Medication Sig   amLODipine (NORVASC) 5 MG tablet Take 0.5 tablets (2.5 mg total) by mouth daily.   aspirin EC 81 MG EC tablet Take 1 tablet (81 mg total) by mouth daily. Swallow whole. (Patient taking differently: Take 81 mg by mouth in the morning. Swallow whole.)   Cholecalciferol (D3 DOTS) 50 MCG (2000 UT) TBDP Take 2,000 Units by mouth in the morning.   isosorbide mononitrate (IMDUR) 30 MG 24  hr tablet Take 1 tablet (30 mg total) by mouth 2 (two) times daily.   levothyroxine (SYNTHROID) 75 MCG tablet Take 1 tablet (75 mcg total) by mouth daily before breakfast.   lisinopril (ZESTRIL) 10 MG tablet Take 1 tablet (10 mg total) by mouth every morning.   metFORMIN (GLUCOPHAGE) 1000 MG tablet Take 1 tablet (1,000 mg total) by mouth 2 (two) times daily with a meal.   metoprolol succinate (TOPROL-XL) 50 MG 24 hr tablet Take 1 tablet (50 mg total) by mouth daily. TAKE WITH OR IMMEDIATELY FOLLOWING A MEAL.   nitroGLYCERIN (NITROSTAT) 0.4 MG SL tablet Place 1 tablet (0.4 mg total) under the tongue every 5 (five) minutes x 3 doses as needed for chest pain.   ondansetron (ZOFRAN-ODT) 4 MG disintegrating tablet Take 4 mg by mouth every 8 (eight) hours as needed for nausea or vomiting.   pantoprazole (PROTONIX) 40 MG tablet Take 40 mg by mouth daily.   Probiotic Product (PROBIOTIC PO) Take 1 capsule by mouth in the morning.   rosuvastatin (CRESTOR) 5 MG tablet Take 1 tablet (5 mg total) by mouth daily. (Patient taking differently: Take 5 mg by mouth daily after lunch.)   Facility-Administered Encounter Medications as of 06/10/2022  Medication   sodium chloride flush (NS) 0.9 % injection 3 mL    Allergies (verified) Patient has no known  allergies.   History: Past Medical History:  Diagnosis Date   Acid reflux    CAD (coronary artery disease)    Moderate LAD disease May 2022 managed medically   Diverticulosis    Hypertension    Hypothyroid    IBS (irritable bowel syndrome)    Type 2 diabetes mellitus    Past Surgical History:  Procedure Laterality Date   CIRCUMCISION     COLONOSCOPY     COLONOSCOPY  06/25/2016   [place], [place] by Dr. Malen Gauze   CORONARY PRESSURE/FFR STUDY N/A 07/25/2020   Procedure: INTRAVASCULAR PRESSURE WIRE/FFR STUDY;  Surgeon: Lorretta Harp, MD;  Location: [place] [facility];  Service: Cardiovascular;  Laterality: N/A;   LEFT HEART CATH AND CORONARY ANGIOGRAPHY  N/A 07/25/2020   Procedure: LEFT HEART CATH AND CORONARY ANGIOGRAPHY;  Surgeon: Lorretta Harp, MD;  Location: [place] [facility];  Service: Cardiovascular;  Laterality: N/A;   meniscus left knee     MENISCUS REPAIR Left 05/28/2017   [place], [place] by Dr. Alroy Dust   TONSILLECTOMY  1953   [place], [place]   Family History  Problem Relation Age of Onset   Cancer Mother    Hypertension Mother    Hyperlipidemia Mother    Hyperlipidemia Father    Hypertension Father    Social History   Socioeconomic History   Marital status: Married    Spouse name: Not on file   Number of children: Not on file   Years of education: Not on file   Highest education level: Not on file  Occupational History   Not on file  Tobacco Use   Smoking status: Never   Smokeless tobacco: Never  Vaping Use   Vaping Use: Never used  Substance and Sexual Activity   Alcohol use: Yes    Comment: rarely   Drug use: Not Currently   Sexual activity: Not on file  Other Topics Concern   Not on file  Social History Narrative   Not on file   Social Determinants of Health   Financial Resource Strain: Low Risk  (06/10/2022)   Overall Financial Resource Strain (CARDIA)    Difficulty of Paying Living Expenses: Not hard at all  Food Insecurity: No Food Insecurity (06/10/2022)   Hunger Vital Sign    Worried About Running Out of Food in the Last Year: Never true    [place] in the Last Year: Never true  Transportation Needs: No Transportation Needs (06/10/2022)   PRAPARE - Hydrologist (Medical): No    Lack of Transportation (Non-Medical): No  Physical Activity: Sufficiently Active (06/10/2022)   Exercise Vital Sign    Days of Exercise per Week: 5 days    Minutes of Exercise per Session: 60 min  Stress: No Stress Concern Present (06/10/2022)   [place]    Feeling of Stress : Not at all  Social Connections: [place] (06/10/2022)   Social Connection and Isolation Panel [NHANES]    Frequency of Communication with Friends and Family: Three times a week    Frequency of Social Gatherings with Friends and Family: More than three times a week    Attends Religious Services: More than 4 times per year    Active Member of [employer] or Organizations: Yes    Attends Archivist Meetings: 1 to 4 times per year    Marital Status: Married    Tobacco Counseling Counseling given: Not  Answered   Clinical Intake:  Pre-visit preparation completed: No  Pain : No/denies pain     BMI - recorded: 34.72 Nutritional Status: BMI > 30  Obese Diabetes: Yes CBG done?: No Did pt. bring in CBG monitor from home?: No  How often do you need to have someone help you when you read instructions, pamphlets, or other written materials from your doctor or pharmacy?: 1 - Never  Diabetic? Yes Hemoglobin 8.9  Interpreter Needed?: No      Activities of Daily Living    06/10/2022    8:18 AM 06/06/2022    8:49 AM  In your present state of health, do you have any difficulty performing the following activities:  Hearing? 0 0  Vision? 0 0  Difficulty concentrating or making decisions? 0 0  Walking or climbing stairs? 0 0  Dressing or bathing? 0 0  Doing errands, shopping? 0 0  Preparing Food and eating ? N N  Using the Toilet? N N  In the past six months, have you accidently leaked urine? N N  Do you have problems with loss of bowel control? N N  Managing your Medications? N N  Managing your Finances? N N  Housekeeping or managing your Housekeeping? N N    Patient Care Team: Del Ria Comment, Lamar Benes, FNP as PCP - General (Family Medicine)  Indicate any recent Medical Services you may have received from other than [facility] providers in the past year (date may be approximate).     Assessment:   This is a routine wellness examination for [place].  Hearing/Vision screen No results found.  Dietary issues  and exercise activities discussed:   Discussed lifestyle modifications follow diet low in saturated fat, reduce dietary salt intake, avoid fatty foods, maintain an exercise routine 3 to 5 days a week for a minimum total of 150 minutes.   Goals Addressed   None    Depression Screen    06/10/2022    8:16 AM 03/18/2022   10:11 AM  PHQ 2/9 Scores  PHQ - 2 Score 0 0  PHQ- 9 Score  3    Fall Risk    06/10/2022    8:17 AM 06/06/2022    8:49 AM 03/18/2022   10:11 AM  Fall Risk   Falls in the past year? 0 0 1  Number falls in past yr: 0 0 0  Injury with Fall? 0 0 0  Risk for fall due to : No Fall Risks    Follow up Falls evaluation completed      FALL RISK PREVENTION PERTAINING TO THE HOME:  Any stairs in or around the home? Yes  If so, are there any without handrails? No  Home free of loose throw rugs in walkways, pet beds, electrical cords, etc? Yes  Adequate lighting in your home to reduce risk of falls? Yes   ASSISTIVE DEVICES UTILIZED TO PREVENT FALLS:  Life alert? No  Use of a cane, walker or w/c? No  Grab bars in the bathroom? Yes  Shower chair or bench in shower? Yes  Elevated toilet seat or a handicapped toilet? No     Cognitive Function:        06/10/2022    8:18 AM  6CIT Screen  What Year? 0 points  What month? 0 points  What time? 0 points  Count back from 20 0 points  Months in reverse 0 points  Repeat phrase 0 points  Total Score 0 points    Immunizations  Immunization History  Administered Date(s) Administered   Influenza Split 01/07/2017   Influenza-Unspecified 12/26/2019, 12/02/2020   Moderna Covid-19 Vaccine Bivalent Booster 59yrs & up 12/30/2020   Moderna Sars-Covid-2 Vaccination 04/20/2019, 05/18/2019, 01/11/2020, 05/16/2020   Pneumococcal Conjugate-13 07/13/2016   Pneumococcal Polysaccharide-23 12/22/2018   Tdap 02/07/2011, 02/17/2021   Zoster Recombinat (Shingrix) 04/20/2019, 05/18/2019, 07/03/2019, 09/04/2019    TDAP status: Up to  date  Flu Vaccine status: Up to date  Pneumococcal vaccine status: Up to date  Covid-19 vaccine status: Information provided on how to obtain vaccines.   Qualifies for Shingles Vaccine? Yes   Zostavax completed Yes   Shingrix Completed?: Yes  Screening Tests Health Maintenance  Topic Date Due   Medicare Annual Wellness (AWV)  Never done   COLONOSCOPY (Pts 45-71yrs Insurance coverage will need to be confirmed)  Never done   COVID-19 Vaccine (6 - 2023-24 season) 06/26/2022 (Originally 11/07/2021)   HEMOGLOBIN A1C  09/17/2022   INFLUENZA VACCINE  10/08/2022   OPHTHALMOLOGY EXAM  02/26/2023   FOOT EXAM  03/19/2023   Diabetic kidney evaluation - eGFR measurement  03/20/2023   Diabetic kidney evaluation - Urine ACR  03/20/2023   DTaP/Tdap/Td (3 - Td or Tdap) 02/18/2031   Pneumonia Vaccine 30+ Years old  Completed   Hepatitis C Screening  Completed   Zoster Vaccines- Shingrix  Completed   HPV VACCINES  Aged Out    Health Maintenance  Health Maintenance Due  Topic Date Due   Medicare Annual Wellness (AWV)  Never done   COLONOSCOPY (Pts 45-65yrs Insurance coverage will need to be confirmed)  Never done    Colorectal cancer screening: Type of screening: Cologuard. Completed Ordered in today's visit. Repeat every   years  Lung Cancer Screening: (Low Dose CT Chest recommended if Age 6-80 years, 30 pack-year currently smoking OR have quit w/in 15years.) does not qualify.   Lung Cancer Screening Referral: N/A  Additional Screening:  Hepatitis C Screening: does not qualify; Completed 2024 Negative  Vision Screening: Recommended annual ophthalmology exams for early detection of glaucoma and other disorders of the eye. Is the patient up to date with their annual eye exam?  Yes  Who is the provider or what is the name of the office in which the patient attends annual eye exams? [facility] If pt is not established with a provider, would they like to be referred to a  provider to establish care? No .   Dental Screening: Recommended annual dental exams for proper oral hygiene  Community Resource Referral / Chronic Care Management: CRR required this visit?  No   CCM required this visit?  No      Plan:     I have personally reviewed and noted the following in the patient's chart:   Medical and social history Use of alcohol, tobacco or illicit drugs  Current medications and supplements including opioid prescriptions. Patient is not currently taking opioid prescriptions. Functional ability and status Nutritional status Physical activity Advanced directives List of other physicians Hospitalizations, surgeries, and ER visits in previous 12 months Vitals Screenings to include cognitive, depression, and falls Referrals and appointments  In addition, I have reviewed and discussed with patient certain preventive protocols, quality metrics, and best practice recommendations. A written personalized care plan for preventive services as well as general preventive health recommendations were provided to patient.     [place], [place]   06/10/2022

## 2022-06-19 ENCOUNTER — Encounter: Payer: Self-pay | Admitting: Family Medicine

## 2022-06-19 ENCOUNTER — Ambulatory Visit (INDEPENDENT_AMBULATORY_CARE_PROVIDER_SITE_OTHER): Payer: Medicare Other | Admitting: Family Medicine

## 2022-06-19 VITALS — BP 127/75 | HR 80 | Ht 70.0 in | Wt 239.0 lb

## 2022-06-19 DIAGNOSIS — I10 Essential (primary) hypertension: Secondary | ICD-10-CM

## 2022-06-19 DIAGNOSIS — E119 Type 2 diabetes mellitus without complications: Secondary | ICD-10-CM

## 2022-06-19 DIAGNOSIS — R109 Unspecified abdominal pain: Secondary | ICD-10-CM | POA: Insufficient documentation

## 2022-06-19 DIAGNOSIS — R1030 Lower abdominal pain, unspecified: Secondary | ICD-10-CM

## 2022-06-19 DIAGNOSIS — Z13220 Encounter for screening for lipoid disorders: Secondary | ICD-10-CM

## 2022-06-19 DIAGNOSIS — E559 Vitamin D deficiency, unspecified: Secondary | ICD-10-CM | POA: Diagnosis not present

## 2022-06-19 DIAGNOSIS — R7301 Impaired fasting glucose: Secondary | ICD-10-CM

## 2022-06-19 NOTE — Assessment & Plan Note (Signed)
Abdominal US ordered- Awaiting results will follow up. Discussed  dietary changes, Advise consume soluble fibers such as psyllium, oat bran, barley, and beans can help IBS symptoms Low FODMAP diet, Start by eliminating FODMAPs foods x 6-8 weeks, then slowly reintroduce foods that are high in fermentable carbohydrates to determine tolerance, or cause. Avoid lactose, gluten, increase physical activity.  Possible Dietician referral, if symptoms persist.

## 2022-06-19 NOTE — Progress Notes (Signed)
Patient Office Visit   Subjective   Patient ID: Bradley Acevedo, male    DOB: April 25, 1945  Age: 77 y.o. MRN: [id number]  CC:  Chief Complaint  Patient presents with   Hypertension    Patient is here for HTN and diabetes f/u.     HPI Bradley Acevedo 77 year old male, presents to clinic for chronic follow up and abdominal pain. He  has a past medical history of Acid reflux, CAD (coronary artery disease), Diverticulosis, Hypertension, Hypothyroid, IBS (irritable bowel syndrome), and Type 2 diabetes mellitus.  Abdominal Pain This is a chronic problem. Patient reports abdominal pain occurs constantly and has been gradually worsening. The pain is located in the periumbilical region and suprapubic region. The quality of the pain is aching and colicky. The abdominal pain radiates to the suprapubic region. Associated symptoms include diarrhea, frequency, nausea and vomiting. Pertinent negatives include no fever or headaches. The pain is aggravated by eating and movement. The pain is relieved by certain positions. He has tried proton pump inhibitors for the symptoms. The treatment provided no relief. His past medical history is significant for GERD. There is no history of abdominal surgery or Crohn's disease.        Outpatient Encounter Medications as of 06/19/2022  Medication Sig   amLODipine (NORVASC) 5 MG tablet Take 0.5 tablets (2.5 mg total) by mouth daily.   aspirin EC 81 MG EC tablet Take 1 tablet (81 mg total) by mouth daily. Swallow whole. (Patient taking differently: Take 81 mg by mouth in the morning. Swallow whole.)   Cholecalciferol (D3 DOTS) 50 MCG (2000 UT) TBDP Take 2,000 Units by mouth in the morning.   isosorbide mononitrate (IMDUR) 30 MG 24 hr tablet Take 1 tablet (30 mg total) by mouth 2 (two) times daily.   levothyroxine (SYNTHROID) 75 MCG tablet Take 1 tablet (75 mcg total) by mouth daily before breakfast.   lisinopril (ZESTRIL) 10 MG tablet Take 1 tablet (10 mg total) by  mouth every morning.   metFORMIN (GLUCOPHAGE) 1000 MG tablet Take 1 tablet (1,000 mg total) by mouth 2 (two) times daily with a meal.   metoprolol succinate (TOPROL-XL) 50 MG 24 hr tablet Take 1 tablet (50 mg total) by mouth daily. TAKE WITH OR IMMEDIATELY FOLLOWING A MEAL.   nitroGLYCERIN (NITROSTAT) 0.4 MG SL tablet Place 1 tablet (0.4 mg total) under the tongue every 5 (five) minutes x 3 doses as needed for chest pain.   ondansetron (ZOFRAN-ODT) 4 MG disintegrating tablet Take 4 mg by mouth every 8 (eight) hours as needed for nausea or vomiting.   pantoprazole (PROTONIX) 40 MG tablet Take 40 mg by mouth daily.   Probiotic Product (PROBIOTIC PO) Take 1 capsule by mouth in the morning.   rosuvastatin (CRESTOR) 5 MG tablet Take 1 tablet (5 mg total) by mouth daily. (Patient taking differently: Take 5 mg by mouth daily after lunch.)   Facility-Administered Encounter Medications as of 06/19/2022  Medication   sodium chloride flush (NS) 0.9 % injection 3 mL    Past Surgical History:  Procedure Laterality Date   CIRCUMCISION     COLONOSCOPY     COLONOSCOPY  06/25/2016   [place], [place] by [name]   CORONARY PRESSURE/FFR STUDY N/A 07/25/2020   Procedure: INTRAVASCULAR PRESSURE WIRE/FFR STUDY;  Surgeon: [name], MD;  Location: [facility] INVASIVE CV LAB;  Service: Cardiovascular;  Laterality: N/A;   LEFT HEART CATH AND CORONARY ANGIOGRAPHY N/A 07/25/2020   Procedure: LEFT HEART  CATH AND CORONARY ANGIOGRAPHY;  Surgeon: [name], MD;  Location: [facility] INVASIVE CV LAB;  Service: Cardiovascular;  Laterality: N/A;   meniscus left knee     MENISCUS REPAIR Left 05/28/2017   [place], [place] by Dr. [name]   TONSILLECTOMY  1953   [place], [place]    Review of Systems  Constitutional:  Negative for chills and fever.  HENT:  Negative for ear pain and tinnitus.   Eyes:  Negative for blurred vision and double vision.  Respiratory:  Negative for shortness of breath.   Cardiovascular:  Negative for  chest pain.  Gastrointestinal:  Positive for abdominal pain, diarrhea, nausea and vomiting. Negative for blood in stool, constipation, heartburn and melena.  Genitourinary:  Negative for dysuria.  Musculoskeletal:  Negative for myalgias.  Neurological:  Negative for dizziness and headaches.      Objective    BP 127/75   Pulse 80   Ht 5\' 10"  (1.778 m)   Wt 239 lb (108.4 kg)   SpO2 93%   BMI 34.29 kg/m   Physical Exam Vitals reviewed.  Constitutional:      General: He is not in acute distress.    Appearance: Normal appearance. He is not ill-appearing, toxic-appearing or diaphoretic.  HENT:     Head: Normocephalic.  Eyes:     General:        Right eye: No discharge.        Left eye: No discharge.     Conjunctiva/sclera: Conjunctivae normal.  Cardiovascular:     Rate and Rhythm: Normal rate.     Pulses: Normal pulses.     Heart sounds: Normal heart sounds.  Pulmonary:     Effort: Pulmonary effort is normal. No respiratory distress.     Breath sounds: Normal breath sounds.  Abdominal:     General: Bowel sounds are normal.     Palpations: Abdomen is soft. There is no mass.     Tenderness: There is no abdominal tenderness. There is no right CVA tenderness, left CVA tenderness or guarding.     Hernia: No hernia is present.  Musculoskeletal:        General: Normal range of motion.     Cervical back: Normal range of motion.  Skin:    General: Skin is warm and dry.     Capillary Refill: Capillary refill takes less than 2 seconds.  Neurological:     General: No focal deficit present.     Mental Status: He is alert and oriented to person, place, and time.     Coordination: Coordination normal.     Gait: Gait normal.  Psychiatric:        Mood and Affect: Mood normal.        Behavior: Behavior normal.        Thought Content: Thought content normal.        Judgment: Judgment normal.       Assessment & Plan:  Lower abdominal pain Assessment & Plan: Abdominal US ordered-  Awaiting results will follow up. Discussed  dietary changes, Advise consume soluble fibers such as psyllium, oat bran, barley, and beans can help IBS symptoms Low FODMAP diet, Start by eliminating FODMAPs foods x 6-8 weeks, then slowly reintroduce foods that are high in fermentable carbohydrates to determine tolerance, or cause. Avoid lactose, gluten, increase physical activity.  Possible Dietician referral, if symptoms persist.   Orders: -     US Abdomen Complete; Future  Primary hypertension -     Microalbumin /  creatinine urine ratio -     CMP14+EGFR -     CBC with Differential/Platelet  IFG (impaired fasting glucose) -     Hemoglobin A1c  Screening for lipid disorders -     Lipid panel  Vitamin D deficiency -     VITAMIN D 25 Hydroxy (Vit-D Deficiency, Fractures)  Type 2 diabetes mellitus without complication, without long-term current use of insulin Assessment & Plan: A1C 8.9 not at goal.  Labs ordered, awaiting results will follow up. Currently on metformin  x2 daily.  Discussed Non pharmacological interventions such as low carb diet,high protein, include vegetables in each meal. Avoid or limit sugary and fatty foods. Educated on importance of physical activity at least 150 minutes per week. Patient verbalizes understanding regarding plan of care and all questions answered. Ophthalmology exam up to date , Foot exam within desired limits    Essential hypertension Assessment & Plan: Vitals:   06/19/22 1010 06/19/22 1014 06/19/22 1045  BP: (!) 147/77 132/78 127/75  Blood pressure readings at goal today's visit Continue amlodipine 5 mg, Lisinopril 10 mg , Metoprolol succinate 50 mg daily DASH diet discussed.     Return in about 3 months (around 09/18/2022) for routine labs, chronic follow-up, hypertension, diabetes.   Cruzita Lederer Newman Nip, FNP

## 2022-06-19 NOTE — Assessment & Plan Note (Addendum)
A1C 8.9 not at goal.  Labs ordered, awaiting results will follow up. Currently on metformin 1000mg  x2 daily.  Discussed Non pharmacological interventions such as low carb diet,high protein, include vegetables in each meal. Avoid or limit sugary and fatty foods. Educated on importance of physical activity at least 150 minutes per week. Patient verbalizes understanding regarding plan of care and all questions answered. Ophthalmology exam up to date , Foot exam within desired limits

## 2022-06-19 NOTE — Assessment & Plan Note (Signed)
Vitals:   06/19/22 1010 06/19/22 1014 06/19/22 1045  BP: (!) 147/77 132/78 127/75  Blood pressure readings at goal today's visit Continue amlodipine 5 mg, Lisinopril 10 mg , Metoprolol succinate 50 mg daily DASH diet discussed.

## 2022-06-19 NOTE — Patient Instructions (Signed)
It was pleasure meeting with you today. Please take medications as prescribed. Follow up with your primary health provider if any health concerns arises. If symptoms worsen please contact your primary care provider and/or visit the emergency department.  

## 2022-06-21 ENCOUNTER — Other Ambulatory Visit: Payer: Self-pay | Admitting: Family Medicine

## 2022-06-21 MED ORDER — EMPAGLIFLOZIN 10 MG PO TABS: 10.0000 mg | ORAL_TABLET | Freq: Every day | ORAL | 1 refills | Status: DC

## 2022-06-21 MED ORDER — METFORMIN HCL 1000 MG PO TABS: 1000.0000 mg | ORAL_TABLET | Freq: Every day | ORAL | 3 refills | Status: DC

## 2022-06-21 NOTE — Progress Notes (Signed)
Please inform patient,  Hemoglobin A1C 7.6, New Plan of treatment will include metformin 1000 mg once daily and Jardiance  10 mg daily - medication sent to pharmacy. Keep up the good work on decreasing your  hemoglobin A1c levels. It is important to follow a DASH diet which includes vegetables,fruits,whole grains, fat free or low fat diary,fish,poultry,beans,nuts and seeds,vegetable oils.  Consume foods in low in saturated fat, reduce dietary salt intake, avoid fatty foods.  Find an activity that you will enjoyandstart to be active at least 5 days a week for 30 minutes each day.  Follow up in 3 months

## 2022-06-22 ENCOUNTER — Other Ambulatory Visit: Payer: Self-pay | Admitting: Family Medicine

## 2022-06-22 MED ORDER — GLIMEPIRIDE 1 MG PO TABS: 1.0000 mg | ORAL_TABLET | Freq: Every day | ORAL | 1 refills | Status: DC

## 2022-06-22 NOTE — Progress Notes (Signed)
Okay he can continue taking metformin 1000 mg twice daily and  I sent in Glimepiride 1.0 mg once daily

## 2022-06-23 LAB — CMP14+EGFR
ALT: 29 IU/L (ref 0–44)
AST: 24 IU/L (ref 0–40)
Albumin/Globulin Ratio: 2.2 (ref 1.2–2.2)
Albumin: 4.4 g/dL (ref 3.8–4.8)
Alkaline Phosphatase: 63 IU/L (ref 44–121)
BUN/Creatinine Ratio: 17 (ref 10–24)
BUN: 18 mg/dL (ref 8–27)
Bilirubin Total: 0.3 mg/dL (ref 0.0–1.2)
CO2: 20 mmol/L (ref 20–29)
Calcium: 9.8 mg/dL (ref 8.6–10.2)
Chloride: 105 mmol/L (ref 96–106)
Creatinine, Ser: 1.03 mg/dL (ref 0.76–1.27)
Globulin, Total: 2 g/dL (ref 1.5–4.5)
Glucose: 139 mg/dL — ABNORMAL HIGH (ref 70–99)
Potassium: 4.9 mmol/L (ref 3.5–5.2)
Sodium: 139 mmol/L (ref 134–144)
Total Protein: 6.4 g/dL (ref 6.0–8.5)
eGFR: 76 mL/min/{1.73_m2} (ref >59)

## 2022-06-23 LAB — VITAMIN D 25 HYDROXY (VIT D DEFICIENCY, FRACTURES): Vit D, 25-Hydroxy: 55.3 ng/mL (ref 30.0–100.0)

## 2022-06-23 LAB — LIPID PANEL
Chol/HDL Ratio: 3 ratio (ref 0.0–5.0)
Cholesterol, Total: 96 mg/dL — ABNORMAL LOW (ref 100–199)
HDL: 32 mg/dL — ABNORMAL LOW (ref >39)
LDL Chol Calc (NIH): 44 mg/dL (ref 0–99)
Triglycerides: 104 mg/dL (ref 0–149)
VLDL Cholesterol Cal: 20 mg/dL (ref 5–40)

## 2022-06-23 LAB — CBC WITH DIFFERENTIAL/PLATELET
Basophils Absolute: 0.1 10*3/uL (ref 0.0–0.2)
Basos: 2 %
EOS (ABSOLUTE): 0.4 10*3/uL (ref 0.0–0.4)
Eos: 5 %
Hematocrit: 38.7 % (ref 37.5–51.0)
Hemoglobin: 13.1 g/dL (ref 13.0–17.7)
Immature Grans (Abs): 0.1 10*3/uL (ref 0.0–0.1)
Immature Granulocytes: 2 %
Lymphocytes Absolute: 1.8 10*3/uL (ref 0.7–3.1)
Lymphs: 23 %
MCH: 31 pg (ref 26.6–33.0)
MCHC: 33.9 g/dL (ref 31.5–35.7)
MCV: 92 fL (ref 79–97)
Monocytes Absolute: 0.5 10*3/uL (ref 0.1–0.9)
Monocytes: 6 %
Neutrophils Absolute: 5 10*3/uL (ref 1.4–7.0)
Neutrophils: 62 %
Platelets: 232 10*3/uL (ref 150–450)
RBC: 4.23 x10E6/uL (ref 4.14–5.80)
RDW: 13.1 % (ref 11.6–15.4)
WBC: 7.8 10*3/uL (ref 3.4–10.8)

## 2022-06-23 LAB — MICROALBUMIN / CREATININE URINE RATIO
Creatinine, Urine: 47.3 mg/dL
Microalb/Creat Ratio: 9 mg/g creat (ref 0–29)
Microalbumin, Urine: 4.2 ug/mL

## 2022-06-23 LAB — HEMOGLOBIN A1C
Est. average glucose Bld gHb Est-mCnc: 171 mg/dL
Hgb A1c MFr Bld: 7.6 % — ABNORMAL HIGH (ref 4.8–5.6)

## 2022-06-25 LAB — COLOGUARD: COLOGUARD: NEGATIVE

## 2022-07-01 ENCOUNTER — Ambulatory Visit (HOSPITAL_COMMUNITY)
Admission: RE | Admit: 2022-07-01 | Discharge: 2022-07-01 | Disposition: A | Discharge status: Home / Self Care | Payer: Medicare Other | Source: Ambulatory Visit | Attending: Family Medicine | Admitting: Family Medicine

## 2022-07-01 DIAGNOSIS — R1030 Lower abdominal pain, unspecified: Secondary | ICD-10-CM | POA: Diagnosis present

## 2022-07-12 ENCOUNTER — Other Ambulatory Visit: Payer: Self-pay

## 2022-07-12 ENCOUNTER — Emergency Department (HOSPITAL_COMMUNITY): Payer: Medicare Other

## 2022-07-12 ENCOUNTER — Emergency Department (HOSPITAL_COMMUNITY)
Admission: EM | Admit: 2022-07-12 | Discharge: 2022-07-12 | Disposition: A | Discharge status: Home / Self Care | Payer: Medicare Other | Attending: Emergency Medicine | Admitting: Emergency Medicine

## 2022-07-12 ENCOUNTER — Encounter (HOSPITAL_COMMUNITY): Payer: Self-pay

## 2022-07-12 DIAGNOSIS — R072 Precordial pain: Secondary | ICD-10-CM | POA: Insufficient documentation

## 2022-07-12 DIAGNOSIS — Z7982 Long term (current) use of aspirin: Secondary | ICD-10-CM | POA: Insufficient documentation

## 2022-07-12 DIAGNOSIS — R079 Chest pain, unspecified: Secondary | ICD-10-CM | POA: Diagnosis present

## 2022-07-12 LAB — CBC
HCT: 36.4 % — ABNORMAL LOW (ref 39.0–52.0)
Hemoglobin: 12.5 g/dL — ABNORMAL LOW (ref 13.0–17.0)
MCH: 31 pg (ref 26.0–34.0)
MCHC: 34.3 g/dL (ref 30.0–36.0)
MCV: 90.3 fL (ref 80.0–100.0)
Platelets: 179 10*3/uL (ref 150–400)
RBC: 4.03 MIL/uL — ABNORMAL LOW (ref 4.22–5.81)
RDW: 13.5 % (ref 11.5–15.5)
WBC: 5.5 10*3/uL (ref 4.0–10.5)
nRBC: 0 % (ref 0.0–0.2)

## 2022-07-12 LAB — BASIC METABOLIC PANEL
Anion gap: 6 (ref 5–15)
BUN: 16 mg/dL (ref 8–23)
CO2: 24 mmol/L (ref 22–32)
Calcium: 8.8 mg/dL — ABNORMAL LOW (ref 8.9–10.3)
Chloride: 105 mmol/L (ref 98–111)
Creatinine, Ser: 1.01 mg/dL (ref 0.61–1.24)
GFR, Estimated: >60 mL/min (ref >60)
Glucose, Bld: 204 mg/dL — ABNORMAL HIGH (ref 70–99)
Potassium: 4.3 mmol/L (ref 3.5–5.1)
Sodium: 135 mmol/L (ref 135–145)

## 2022-07-12 LAB — TROPONIN I (HIGH SENSITIVITY)
Troponin I (High Sensitivity): 3 ng/L (ref <18)
Troponin I (High Sensitivity): 3 ng/L (ref <18)

## 2022-07-12 MED ORDER — ACETAMINOPHEN 500 MG PO TABS
1000.0000 mg | ORAL_TABLET | Freq: Once | ORAL | Status: AC
  Administered 2022-07-12: 1000 mg via ORAL
  Filled 2022-07-12: qty 2

## 2022-07-12 MED ORDER — ALUM & MAG HYDROXIDE-SIMETH 200-200-20 MG/5ML PO SUSP
30.0000 mL | Freq: Once | ORAL | Status: AC
  Administered 2022-07-12: 30 mL via ORAL
  Filled 2022-07-12: qty 30

## 2022-07-12 MED ORDER — FAMOTIDINE 20 MG PO TABS
20.0000 mg | ORAL_TABLET | Freq: Once | ORAL | Status: AC
  Administered 2022-07-12: 20 mg via ORAL
  Filled 2022-07-12: qty 1

## 2022-07-12 MED ORDER — IBUPROFEN 400 MG PO TABS
400.0000 mg | ORAL_TABLET | Freq: Once | ORAL | Status: AC
  Administered 2022-07-12: 400 mg via ORAL
  Filled 2022-07-12: qty 1

## 2022-07-12 NOTE — ED Provider Notes (Signed)
EMERGENCY DEPARTMENT AT [facility] Provider Note   CSN: [id number] Arrival date & time: 07/12/22  1000     History  Chief Complaint  Patient presents with   Chest Pain    Bradley Acevedo is a 77 y.o. male.  Pt c/o pain to mid chest in past two days. Mild dull pressure sensation, at rest, constant/persistent, non radiating, without specific exacerbating or alleviating factors. Tried ntg x 2, no change. Denies recent exertional chest pain or discomfort. No sob or unusual doe. No associated nausea/vomiting or diaphoresis. No chest wall injury or strain. Occasional non prod cough. No sore throat. No fevers. No heartburn. No leg pain or swelling. No pleuritic pain. No hx dvt or pe. Notes prior cardiac cath with '50% blockage'.   The history is provided by the patient, a relative and medical records.  Chest Pain Associated symptoms: cough   Associated symptoms: no abdominal pain, no back pain, no fever, no headache, no nausea, no palpitations, no shortness of breath and no vomiting        Home Medications Prior to Admission medications   Medication Sig Start Date End Date Taking? Authorizing Provider  amLODipine (NORVASC) 5 MG tablet Take 0.5 tablets (2.5 mg total) by mouth daily. 03/18/22   Del Nigel Berthold, FNP  aspirin EC 81 MG EC tablet Take 1 tablet (81 mg total) by mouth daily. Swallow whole. Patient taking differently: Take 81 mg by mouth in the morning. Swallow whole. 06/30/20   Sherryll Burger, Pratik D, DO  Cholecalciferol (D3 DOTS) 50 MCG (2000 UT) TBDP Take 2,000 Units by mouth in the morning.    [provider]  empagliflozin (JARDIANCE) 10 MG TABS tablet Take 1 tablet (10 mg total) by mouth daily before breakfast. 06/21/22   Del Newman Nip, Tenna Child, FNP  glimepiride (AMARYL) 1 MG tablet Take 1 tablet (1 mg total) by mouth daily with breakfast. 06/22/22   Del Nigel Berthold, FNP  isosorbide mononitrate (IMDUR) 30 MG 24 hr tablet Take 1 tablet  (30 mg total) by mouth 2 (two) times daily. 04/08/22 04/03/23  Jonelle Sidle, MD  levothyroxine (SYNTHROID) 75 MCG tablet Take 1 tablet (75 mcg total) by mouth daily before breakfast. 05/19/22   Nida, Denman George, MD  lisinopril (ZESTRIL) 10 MG tablet Take 1 tablet (10 mg total) by mouth every morning. 03/18/22   Del Nigel Berthold, FNP  metFORMIN (GLUCOPHAGE) 1000 MG tablet Take 1 tablet (1,000 mg total) by mouth daily. 06/21/22   Del Nigel Berthold, FNP  metoprolol succinate (TOPROL-XL) 50 MG 24 hr tablet Take 1 tablet (50 mg total) by mouth daily. TAKE WITH OR IMMEDIATELY FOLLOWING A MEAL. 03/18/22   Del Nigel Berthold, FNP  nitroGLYCERIN (NITROSTAT) 0.4 MG SL tablet Place 1 tablet (0.4 mg total) under the tongue every 5 (five) minutes x 3 doses as needed for chest pain. 09/29/21   [name], PA-C  ondansetron (ZOFRAN-ODT) 4 MG disintegrating tablet Take 4 mg by mouth every 8 (eight) hours as needed for nausea or vomiting. 01/29/20   [provider]  pantoprazole (PROTONIX) 40 MG tablet Take 40 mg by mouth daily.    [provider]  Probiotic Product (PROBIOTIC PO) Take 1 capsule by mouth in the morning.    [provider]  rosuvastatin (CRESTOR) 5 MG tablet Take 1 tablet (5 mg total) by mouth daily. Patient taking differently: Take 5 mg by mouth daily after lunch. 07/16/20 10/20/21  Jonelle Sidle, MD      Allergies    Patient has no known allergies.    Review of Systems   Review of Systems  Constitutional:  Negative for fever.  HENT:  Negative for sore throat.   Eyes:  Negative for redness.  Respiratory:  Positive for cough. Negative for shortness of breath.   Cardiovascular:  Positive for chest pain. Negative for palpitations and leg swelling.  Gastrointestinal:  Negative for abdominal pain, nausea and vomiting.  Genitourinary:  Negative for flank pain.  Musculoskeletal:  Negative for back pain and neck pain.  Skin:  Negative for  rash.  Neurological:  Negative for headaches.  Hematological:  Does not bruise/bleed easily.  Psychiatric/Behavioral:  Negative for confusion.     Physical Exam Updated Vital Signs BP 134/73 (BP Location: Right Arm)   Pulse 73   Temp 97.9 F (36.6 C) (Oral)   Resp 18   Ht 1.778 m (5\' 10" )   Wt 110.2 kg   SpO2 100%   BMI 34.87 kg/m  Physical Exam Vitals and nursing note reviewed.  Constitutional:      Appearance: Normal appearance. He is well-developed.  HENT:     Head: Atraumatic.     Nose: Nose normal.     Mouth/Throat:     Mouth: Mucous membranes are moist.  Eyes:     General: No scleral icterus.    Conjunctiva/sclera: Conjunctivae normal.  Neck:     Trachea: No tracheal deviation.  Cardiovascular:     Rate and Rhythm: Normal rate and regular rhythm.     Pulses: Normal pulses.     Heart sounds: Normal heart sounds. No murmur heard.    No friction rub. No gallop.  Pulmonary:     Effort: Pulmonary effort is normal. No accessory muscle usage or respiratory distress.     Breath sounds: Normal breath sounds.  Chest:     Chest wall: No tenderness.  Abdominal:     General: Bowel sounds are normal. There is no distension.     Palpations: Abdomen is soft.     Tenderness: There is no abdominal tenderness.  Musculoskeletal:        General: No swelling or tenderness.     Cervical back: Normal range of motion and neck supple. No rigidity.     Right lower leg: No edema.     Left lower leg: No edema.  Skin:    General: Skin is warm and dry.     Findings: No rash.  Neurological:     Mental Status: He is alert.     Comments: Alert, speech clear.   Psychiatric:        Mood and Affect: Mood normal.     ED Results / Procedures / Treatments   Labs (all labs ordered are listed, but only abnormal results are displayed) Results for orders placed or performed during the hospital encounter of 07/12/22  Basic metabolic panel  Result Value Ref Range   Sodium 135 135 - 145  mmol/L   Potassium 4.3 3.5 - 5.1 mmol/L   Chloride 105 98 - 111 mmol/L   CO2 24 22 - 32 mmol/L   Glucose, Bld 204 (H) 70 - 99 mg/dL   BUN 16 8 - 23 mg/dL   Creatinine, Ser 1.61 0.61 - 1.24 mg/dL   Calcium 8.8 (L) 8.9 - 10.3 mg/dL   GFR, Estimated >09 >60 mL/min   Anion gap 6 5 - 15  CBC  Result Value Ref  Range   WBC 5.5 4.0 - 10.5 K/uL   RBC 4.03 (L) 4.22 - 5.81 MIL/uL   Hemoglobin 12.5 (L) 13.0 - 17.0 g/dL   HCT 13.0 (L) 86.5 - 78.4 %   MCV 90.3 80.0 - 100.0 fL   MCH 31.0 26.0 - 34.0 pg   MCHC 34.3 30.0 - 36.0 g/dL   RDW 69.6 29.5 - 28.4 %   Platelets 179 150 - 400 K/uL   nRBC 0.0 0.0 - 0.2 %  Troponin I (High Sensitivity)  Result Value Ref Range   Troponin I (High Sensitivity) 3 <18 ng/L  Troponin I (High Sensitivity)  Result Value Ref Range   Troponin I (High Sensitivity) 3 <18 ng/L   DG Chest Port 1 View  Result Date: 07/12/2022 CLINICAL DATA:  Chest pain. EXAM: PORTABLE CHEST 1 VIEW COMPARISON:  06/28/2020. FINDINGS: Cardiac silhouette is normal in size and configuration. Normal mediastinal and hilar contours. Clear lungs.  No pleural effusion or pneumothorax. Skeletal structures are grossly intact. IMPRESSION: No active disease. Electronically Signed   By: Amie Portland M.D.   On: 07/12/2022 11:59   US Abdomen Complete  Result Date: 07/04/2022 CLINICAL DATA:  abdominal pain with diarrhea and vomiting for one month EXAM: ABDOMEN ULTRASOUND COMPLETE COMPARISON:  None Available. FINDINGS: Gallbladder: No gallstones or wall thickening visualized. No pericholecystic fluid. Common bile duct: Diameter: 0.4cm. Liver: No focal lesion identified. Normal homogeneous echogenicity. Hepatopetal portal vein. IVC: No abnormality visualized. Pancreas: Visualized portion unremarkable. Spleen: Size and appearance within normal limits. Right Kidney: Length: 12.1cm. Echogenicity within normal limits. No mass or hydronephrosis visualized. Left Kidney: Length: 12.4cm. Echogenicity within normal  limits. Lower pole cyst on the left measures 1.1 cm. Midpole cyst measures 2.2 cm. Abdominal aorta: No aneurysm visualized. IMPRESSION: Left kidney cysts. Otherwise unremarkable examination of the abdomen. Electronically Signed   By: Layla Maw M.D.   On: 07/04/2022 17:22    EKG EKG Interpretation  Date/Time:  Sunday Jul 12 2022 10:21:04 EDT Ventricular Rate:  74 PR Interval:  165 QRS Duration: 90 QT Interval:  378 QTC Calculation: 420 R Axis:   62 Text Interpretation: Sinus rhythm Nonspecific ST abnormality No significant change since last tracing Confirmed by Cathren Laine ([id number]) on 07/12/2022 10:26:26 AM  Radiology DG Chest Port 1 View  Result Date: 07/12/2022 CLINICAL DATA:  Chest pain. EXAM: PORTABLE CHEST 1 VIEW COMPARISON:  06/28/2020. FINDINGS: Cardiac silhouette is normal in size and configuration. Normal mediastinal and hilar contours. Clear lungs.  No pleural effusion or pneumothorax. Skeletal structures are grossly intact. IMPRESSION: No active disease. Electronically Signed   By: Amie Portland M.D.   On: 07/12/2022 11:59    Procedures Procedures    Medications Ordered in ED Medications  ibuprofen (ADVIL) tablet 400 mg (has no administration in time range)  acetaminophen (TYLENOL) tablet 1,000 mg (1,000 mg Oral Given 07/12/22 1122)  alum & mag hydroxide-simeth (MAALOX/MYLANTA) 200-200-20 MG/5ML suspension 30 mL (30 mLs Oral Given 07/12/22 1122)  famotidine (PEPCID) tablet 20 mg (20 mg Oral Given 07/12/22 1122)    ED Course/ Medical Decision Making/ A&P                             Medical Decision Making Problems Addressed: Precordial chest pain: acute illness or injury with systemic symptoms that poses a threat to life or bodily functions  Amount and/or Complexity of Data Reviewed Independent Historian:     Details: Family, hx External  Data Reviewed: notes. Labs: ordered. Decision-making details documented in ED Course. Radiology: ordered and independent  interpretation performed. Decision-making details documented in ED Course. ECG/medicine tests: ordered and independent interpretation performed. Decision-making details documented in ED Course.  Risk OTC drugs. Prescription drug management. Decision regarding hospitalization.   Iv ns. Continuous pulse ox and cardiac monitoring. Labs ordered/sent. Imaging ordered.   Differential diagnosis includes ACS, msk cp, gi cp, etc. Dispo decision including potential need for admission considered - will get labs and imaging and reassess.   Reviewed nursing notes and prior charts for additional history. External reports reviewed. Additional history from: family.   Cardiac monitor: sinus rhythm, rate 70.  Labs reviewed/interpreted by me - trop normal.   Xrays reviewed/interpreted by me - no pna.   Additional labs reviewed/interpreted by me - delta trop normal/not increasing - after constant symptoms for past 1-2 days, felt not c/w acs.   Pt currently appears stable for d/c.   Rec close cardiology f/u this week.   Return precautions provided.          Final Clinical Impression(s) / ED Diagnoses Final diagnoses:  None    Rx / DC Orders ED Discharge Orders     None         Cathren Laine, MD 07/12/22 1337

## 2022-07-12 NOTE — Discharge Instructions (Addendum)
It was our pleasure to provide your ER care today - we hope that you feel better.  Follow up closely with cardiologist this week - a referral was made - they should be contacting you with an appointment in the next 1-2 days.   Return to ER right away if worse, new symptoms, high fevers, increased trouble breathing, recurrent or persistent chest pain, weak/fainting, or other emergency concern.

## 2022-07-12 NOTE — ED Triage Notes (Signed)
Patient arrives with chest pain over the last couple days. He says it was a sharp pain. Today it feels like a pressure and radiates into his left arm. Patient took a dose of nitro at 0800 with some relief

## 2022-07-13 ENCOUNTER — Telehealth: Payer: Self-pay | Admitting: Cardiology

## 2022-07-13 NOTE — Telephone Encounter (Signed)
Pt c/o of Chest Pain: STAT if CP now or developed within 24 hours  1. Are you having CP right now? Yes  2. Are you experiencing any other symptoms (ex. SOB, nausea, vomiting, sweating)? No  3. How long have you been experiencing CP? 2 days  4. Is your CP continuous or coming and going? Continuous  5. Have you taken Nitroglycerin? No ? Call transferred

## 2022-07-13 NOTE — Telephone Encounter (Signed)
Patient was seen in the ED yesterday and told to f/u with cardiology. He calls today to say he has had increased use of NTG in the last several weeks. He notices CP while sitting, takes a NTG and pain is relieved but sx's return in an hour or so. His Imdur was increased in January to 30 mg bid and was doing well.  He took a NTG and reports pain is easing, he knows the three NTG taken 5 minutes a part rule. He says he does not want to go back to the ED as they did not do anything for him.   Current BP 139/76, HR 78   Appointment was made for this Thursday, 07/16/22 in the [place] office with E.Peck,NP    I will FYI Dr.McDowell

## 2022-07-13 NOTE — Telephone Encounter (Signed)
New appt made for tomorrow at 3:30 pm with Randall An, PA.

## 2022-07-14 ENCOUNTER — Ambulatory Visit: Payer: Medicare Other | Attending: Nurse Practitioner | Admitting: Student

## 2022-07-14 ENCOUNTER — Encounter: Payer: Self-pay | Admitting: Student

## 2022-07-14 VITALS — BP 134/72 | HR 80 | Ht 70.0 in | Wt 246.0 lb

## 2022-07-14 DIAGNOSIS — R079 Chest pain, unspecified: Secondary | ICD-10-CM | POA: Diagnosis not present

## 2022-07-14 DIAGNOSIS — I25118 Atherosclerotic heart disease of native coronary artery with other forms of angina pectoris: Secondary | ICD-10-CM | POA: Diagnosis not present

## 2022-07-14 DIAGNOSIS — E785 Hyperlipidemia, unspecified: Secondary | ICD-10-CM | POA: Insufficient documentation

## 2022-07-14 DIAGNOSIS — I10 Essential (primary) hypertension: Secondary | ICD-10-CM | POA: Insufficient documentation

## 2022-07-14 DIAGNOSIS — E119 Type 2 diabetes mellitus without complications: Secondary | ICD-10-CM | POA: Diagnosis present

## 2022-07-14 DIAGNOSIS — Z7985 Long-term (current) use of injectable non-insulin antidiabetic drugs: Secondary | ICD-10-CM

## 2022-07-14 MED ORDER — ISOSORBIDE MONONITRATE ER 60 MG PO TB24: 60.0000 mg | ORAL_TABLET | Freq: Every day | ORAL | 3 refills | Status: DC

## 2022-07-14 NOTE — Progress Notes (Signed)
Cardiology Office Note    Date:  07/14/2022  ID:  Bradley Acevedo, DOB 1945-04-27, MRN [id number] Cardiologist: Bradley Dell, MD    History of Present Illness:    Bradley Acevedo is a 77 y.o. male with past medical history of CAD (s/p cath in 07/2020 showing 50% mid-LAD stenosis which was borderline by [facility] and medical management was pursued at that time given that he would require a fairly long stent), HTN, HLD, Type 2 DM,  Hypothyroidism and IBS who presents to the office today for follow-up from a recent Emergency Department visit.   He was examined by Dr. Diona Acevedo in 02/2022 and denied any recent anginal symptoms at that time. He was continued on his current cardiac medications including Amlodipine 2.5 mg daily, ASA 81 mg daily, Imdur 30 mg daily, Lisinopril 10 mg daily, Toprol-XL 50 mg daily and Crestor 5 mg daily.  He did call the office in 03/2022 reporting intermittent episodes of chest pain and Imdur was titrated to 30 mg twice daily. He most recently presented to [facility] ED on 07/12/2022 for evaluation of intermittent chest pain over the past few days which would radiate into his left arm. Hs troponin values were negative at 3 and EKG was without acute ST changes. He was discharged home and informed to follow-up with Cardiology as an outpatient.  In talking with the patient today, he reports having intermittent episodes of chest pain for the past week. He describes this as a pressure which is across his precordium and can radiate to his left arm at times. The pain can occur at rest or with activity but waxes and wanes in intensity. He does take sublingual nitroglycerin with improvement in his symptoms. He denies any specific dyspnea on exertion, orthopnea, PND or pitting edema.  Studies Reviewed:   EKG: EKG is not ordered today. EKG from 07/12/2022 is reviewed and shows NSR, HR 74 with no acute ST changes.   Cardiac Catheterization: 07/2020 Prox LAD to Mid LAD lesion is 50% stenosed.    IMPRESSION: Bradley Acevedo has an intermediate fairly long proximal LAD lesion with a borderline significant RFR on minimal antianginals.  He required a fairly long stent and a relatively smallish caliber LAD beginning near ostial.  I recommend optimizing antianginal medications and recommend intervention for recalcitrant symptoms.  The sheath was removed and a TR band was placed on the right wrist to achieve patent hemostasis.  The patient left lab in stable condition.  These recommendations were communicated with his attending cardiologist, Dr. Simona Acevedo.    Physical Exam:   VS:  BP 134/72   Pulse 80   Ht 5\' 10"  (1.778 m)   Wt 246 lb (111.6 kg)   SpO2 96%   BMI 35.30 kg/m    Wt Readings from Last 3 Encounters:  07/14/22 246 lb (111.6 kg)  07/12/22 243 lb (110.2 kg)  06/19/22 239 lb (108.4 kg)     GEN: Well nourished, well developed male appearing in no acute distress NECK: No JVD; No carotid bruits CARDIAC: RRR, no murmurs, rubs, gallops RESPIRATORY:  Clear to auscultation without rales, wheezing or rhonchi  ABDOMEN: Appears non-distended. No obvious abdominal masses. EXTREMITIES: No clubbing or cyanosis. No pitting edema.  Distal pedal pulses are 2+ bilaterally.   Assessment and Plan:   1. Chest Pain - His recent episodes of chest pain are concerning for accelerating angina but can occur at rest or with activity. He did rule-out for ACS during recent Emergency  Department evaluation. We discussed ischemic testing with a repeat cardiac catheterization or NST and he wants to avoid a catheterization if able given that his wife is currently going through chemotherapy and he is having to help her frequently during this time. Therefore, will arrange for a Lexiscan Myoview. If abnormal, he would be in agreement for a catheterization. Will also titrate Imdur from 30mg  BID to 60mg  BID. We reviewed that if he develops headaches with this, can try taking 45mg  BID.   2. CAD - Prior cath in  07/2020 showed 50% mid-LAD stenosis which was borderline by FFR and medical management was pursued at that time given that he would require a fairly long stent.  - Will arrange for repeat ischemic testing. Continue ASA 81mg  daily,  Toprol-XL 50mg  daily and Crestor 5mg  daily. Will titrate Imdur from 30mg  BID to 60mg  BID.   3. HTN - His BP is well-controlled at 134/72 during today's visit. Continue current medical therapy with Amlodipine 2.5mg  daily, Lisinopril 10mg  daily and Toprol-XL 50mg  daily. Will titrate Imdur as discussed above.   4. HLD - FLP in 06/2022 showed total cholesterol 96, triglycerides 104, HDL 32 and LDL 44. Continue Crestor 5 mg daily.  5. Type 2 DM - Hgb A1c was at 7.6 in 06/2022. Followed by his PCP.    Signed, Bradley Lennox, PA-C

## 2022-07-14 NOTE — Patient Instructions (Signed)
Medication Instructions:  Your physician has recommended you make the following change in your medication:   Increase Imdur to 60 mg Two Times Daily    *If you need a refill on your cardiac medications before your next appointment, please call your pharmacy*   Lab Work: NONE   If you have labs (blood work) drawn today and your tests are completely normal, you will receive your results only by: MyChart Message (if you have MyChart) OR A paper copy in the mail If you have any lab test that is abnormal or we need to change your treatment, we will call you to review the results.   Testing/Procedures: Your physician has requested that you have a lexiscan myoview. For further information please visit [email or web address] Please follow instruction sheet, as given.    Follow-Up: At [facility], you and your health needs are our priority.  As part of our continuing mission to provide you with exceptional heart care, we have created designated Provider Care Teams.  These Care Teams include your primary Cardiologist (physician) and Advanced Practice Providers (APPs -  Physician Assistants and Nurse Practitioners) who all work together to provide you with the care you need, when you need it.  We recommend signing up for the patient portal called "MyChart".  Sign up information is provided on this After Visit Summary.  MyChart is used to connect with patients for Virtual Visits (Telemedicine).  Patients are able to view lab/test results, encounter notes, upcoming appointments, etc.  Non-urgent messages can be sent to your provider as well.   To learn more about what you can do with MyChart, go to [email or web address].    Your next appointment:    July   Provider:   Nona Dell, MD    Other Instructions Thank you for choosing [facility]!

## 2022-07-16 ENCOUNTER — Ambulatory Visit: Payer: Medicare Other | Admitting: Nurse Practitioner

## 2022-07-23 ENCOUNTER — Encounter (HOSPITAL_COMMUNITY)
Admission: RE | Admit: 2022-07-23 | Discharge: 2022-07-23 | Disposition: A | Discharge status: Home / Self Care | Payer: Medicare Other | Source: Ambulatory Visit | Attending: Student | Admitting: Student

## 2022-07-23 ENCOUNTER — Ambulatory Visit (HOSPITAL_COMMUNITY)
Admission: RE | Admit: 2022-07-23 | Discharge: 2022-07-23 | Disposition: A | Discharge status: Home / Self Care | Payer: Medicare Other | Source: Ambulatory Visit | Attending: Student | Admitting: Student

## 2022-07-23 DIAGNOSIS — R079 Chest pain, unspecified: Secondary | ICD-10-CM | POA: Diagnosis not present

## 2022-07-23 LAB — NM MYOCAR MULTI W/SPECT W/WALL MOTION / EF
Base ST Depression (mm): 0 mm
LV dias vol: 96 mL (ref 62–150)
LV sys vol: 41 mL
Nuc Stress EF: 58 %
Peak HR: 107 {beats}/min
RATE: 0.4
Rest HR: 67 {beats}/min
Rest Nuclear Isotope Dose: 10.6 mCi
SDS: 3
SRS: 0
SSS: 3
ST Depression (mm): 0 mm
Stress Nuclear Isotope Dose: 29.7 mCi
TID: 1.09

## 2022-07-23 MED ORDER — SODIUM CHLORIDE FLUSH 0.9 % IV SOLN
INTRAVENOUS | Status: AC
  Administered 2022-07-23: 10 mL via INTRAVENOUS
  Filled 2022-07-23: qty 10

## 2022-07-23 MED ORDER — TECHNETIUM TC 99M TETROFOSMIN IV KIT
29.7000 | PACK | Freq: Once | INTRAVENOUS | Status: AC | PRN
  Administered 2022-07-23: 29.7 via INTRAVENOUS

## 2022-07-23 MED ORDER — TECHNETIUM TC 99M TETROFOSMIN IV KIT
10.6000 | PACK | Freq: Once | INTRAVENOUS | Status: AC | PRN
  Administered 2022-07-23: 10.6 via INTRAVENOUS

## 2022-07-23 MED ORDER — REGADENOSON 0.4 MG/5ML IV SOLN
INTRAVENOUS | Status: AC
  Administered 2022-07-23: 0.4 mg
  Filled 2022-07-23: qty 5

## 2022-07-24 ENCOUNTER — Telehealth: Payer: Self-pay

## 2022-07-24 MED ORDER — ISOSORBIDE MONONITRATE ER 60 MG PO TB24: 60.0000 mg | ORAL_TABLET | Freq: Two times a day (BID) | ORAL | 3 refills | Status: DC

## 2022-07-24 NOTE — Telephone Encounter (Signed)
-----   Message from Ellsworth Lennox, [place] sent at 07/24/2022  7:48 AM EDT ----- Please let the patient know that his stress test showed no evidence of a prior heart attack but he was noted to have a small area of ischemia (potential blockage) along the apex and similar to prior stress testing. Pumping function of his heart is normal. Overall, a low-risk study given the small area of ischemia. At the time of his stress test, he reported that his symptoms had improved with titration of Imdur. If he is still feeling well with this, can continue with medical therapy. He does have a scheduled follow-up visit with Dr. [name] in 09/2022 and would keep that for now. If he experiences any progression of symptoms in the interim, he should make [place] aware.   ##Also, please update his Rx for Imdur. This was previously titrated from 30mg  BID to 60mg  BID but was sent in as 60mg  daily. Should be 60mg  BID.

## 2022-07-24 NOTE — Telephone Encounter (Signed)
Patient notified and verbalized understanding. Patient had no questions or concerns at this time. Updated RX sent to [facility].

## 2022-08-12 IMAGING — DX DG CHEST 1V PORT
1 series · 1 of 1 positions shown · non-contrast
Comparison: None.

CLINICAL DATA: Chest pain

EXAM:
PORTABLE CHEST 1 VIEW

[chest ap]
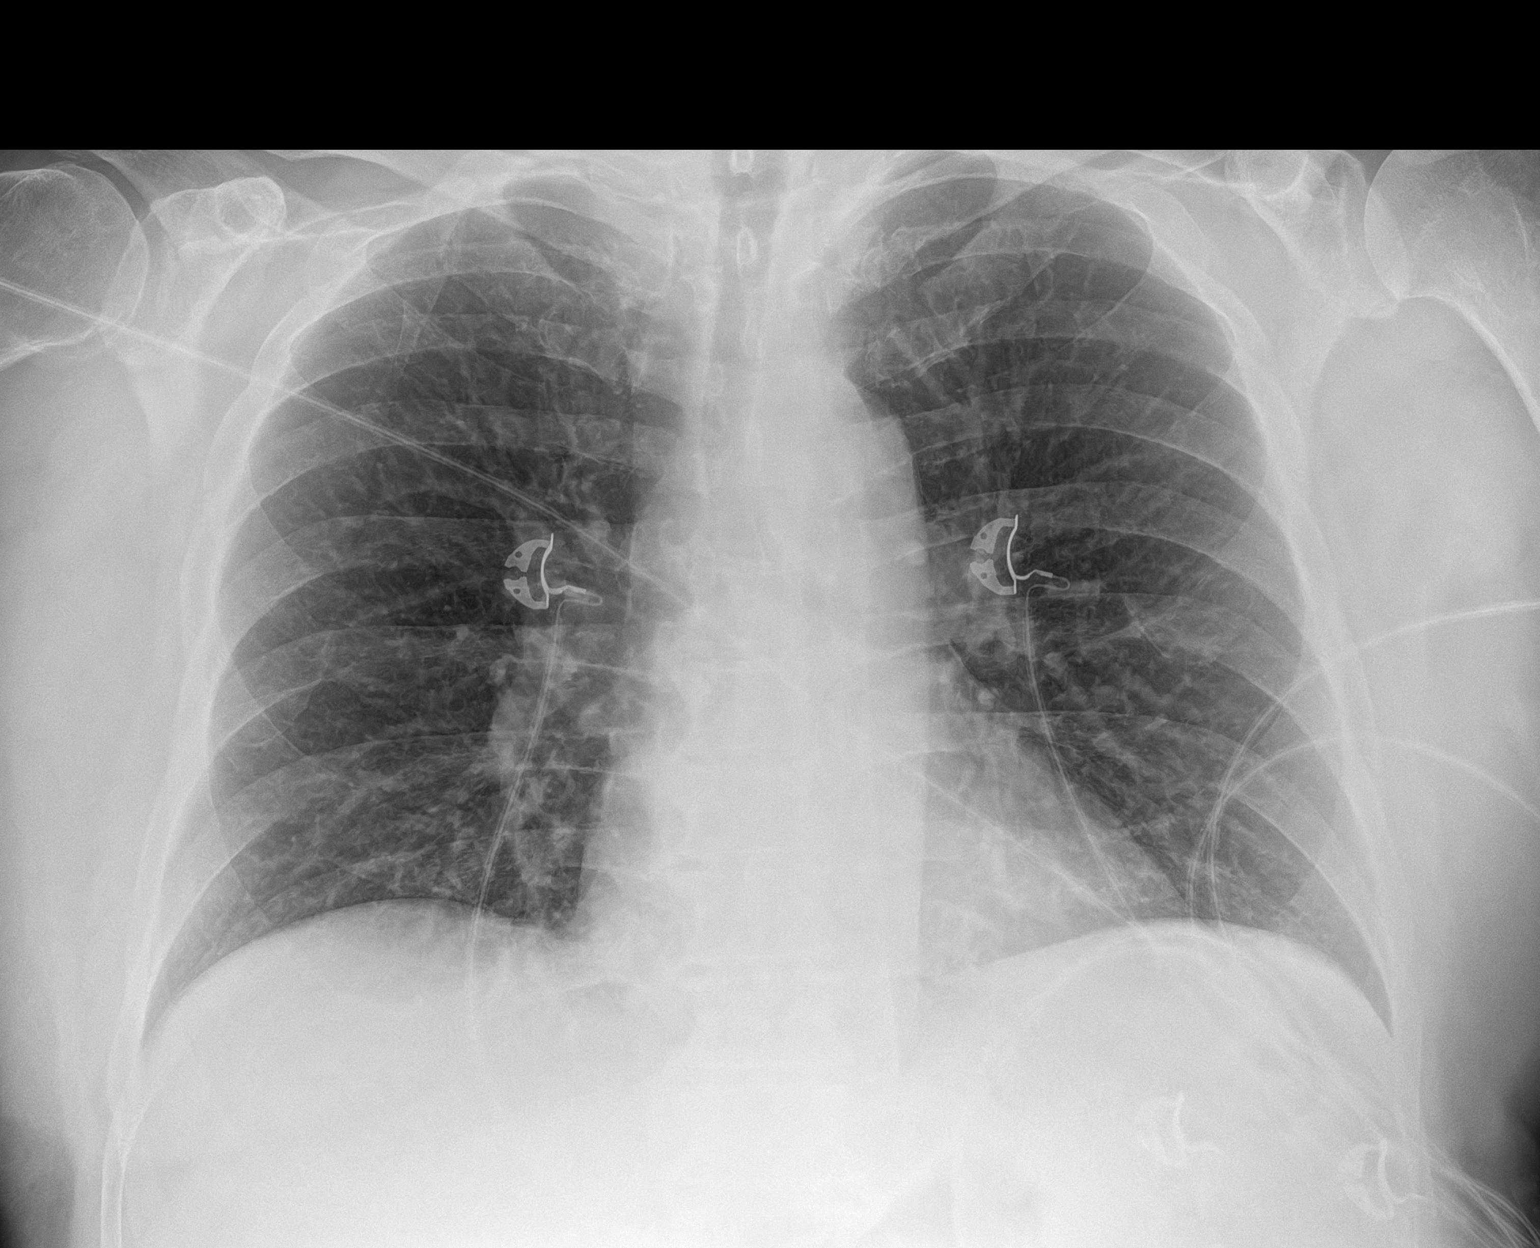

[1 of 1 positions shown; findings below may reference images not displayed]

FINDINGS: The heart size and mediastinal contours are within normal limits.
Both lungs are clear. No pleural effusion or pneumothorax. The
visualized skeletal structures are unremarkable.
IMPRESSION: No active disease.

## 2022-08-20 ENCOUNTER — Ambulatory Visit: Payer: Medicare Other | Admitting: "Endocrinology

## 2022-08-27 LAB — HM DIABETES EYE EXAM

## 2022-09-21 ENCOUNTER — Ambulatory Visit (INDEPENDENT_AMBULATORY_CARE_PROVIDER_SITE_OTHER): Payer: Medicare Other | Admitting: Family Medicine

## 2022-09-21 ENCOUNTER — Encounter: Payer: Self-pay | Admitting: Family Medicine

## 2022-09-21 VITALS — BP 122/71 | HR 69 | Ht 70.0 in | Wt 242.1 lb

## 2022-09-21 DIAGNOSIS — E119 Type 2 diabetes mellitus without complications: Secondary | ICD-10-CM | POA: Diagnosis not present

## 2022-09-21 DIAGNOSIS — I10 Essential (primary) hypertension: Secondary | ICD-10-CM

## 2022-09-21 DIAGNOSIS — E782 Mixed hyperlipidemia: Secondary | ICD-10-CM

## 2022-09-21 NOTE — Progress Notes (Signed)
Patient Office Visit   Subjective   Patient ID: Bradley Acevedo, male    DOB: Jul 26, 1945  Age: 77 y.o. MRN: [id number]  CC:  Chief Complaint  Patient presents with   Chronic Care Management    3 month f/u    HPI Bradley Acevedo 77 year old male, presents to the clinic for HTN management and type 2 diabetes follow up. He  has a past medical history of Acid reflux, CAD (coronary artery disease), Diverticulosis, Hypertension, Hypothyroid, IBS (irritable bowel syndrome), and Type 2 diabetes mellitus (HCC).  Diabetes He presents for his follow-up diabetic visit. He has type 2 diabetes mellitus. His disease course has been improving. Hypoglycemia symptoms include dizziness. Pertinent negatives for hypoglycemia include no confusion, headaches or tremors. Pertinent negatives for diabetes include no blurred vision, no chest pain, no polydipsia, no polyphagia and no polyuria. Pertinent negatives for hypoglycemia complications include no blackouts. Symptoms are improving. Risk factors for coronary artery disease include diabetes mellitus, hypertension, male sex and obesity. Current diabetic treatment includes oral agent (dual therapy). He is compliant with treatment all of the time. He is following a low fat/cholesterol diet. He has not had a previous visit with a dietitian. He participates in exercise intermittently. An ACE inhibitor/angiotensin II receptor blocker is being taken. He does not see a podiatrist.Eye exam is current.  Hypertension This is a chronic problem. The problem has been rapidly improving since onset. The problem is controlled. Pertinent negatives include no blurred vision, chest pain, headaches, peripheral edema or shortness of breath. Risk factors for coronary artery disease include obesity, male gender and diabetes mellitus. Past treatments include angiotensin blockers and beta blockers. The current treatment provides significant improvement. Compliance problems include exercise.        Outpatient Encounter Medications as of 09/21/2022  Medication Sig   amLODipine (NORVASC) 5 MG tablet Take 0.5 tablets (2.5 mg total) by mouth daily.   aspirin EC 81 MG EC tablet Take 1 tablet (81 mg total) by mouth daily. Swallow whole. (Patient taking differently: Take 81 mg by mouth in the morning. Swallow whole.)   Cholecalciferol (D3 DOTS) 50 MCG (2000 UT) TBDP Take 2,000 Units by mouth in the morning.   diphenhydrAMINE (BENADRYL) 25 MG tablet Take 25 mg by mouth daily.   glimepiride (AMARYL) 1 MG tablet Take 1 tablet (1 mg total) by mouth daily with breakfast.   isosorbide mononitrate (IMDUR) 60 MG 24 hr tablet Take 1 tablet (60 mg total) by mouth in the morning and at bedtime.   levothyroxine (SYNTHROID) 50 MCG tablet Take 50 mcg by mouth daily before breakfast.   lisinopril (ZESTRIL) 10 MG tablet Take 1 tablet (10 mg total) by mouth every morning.   metFORMIN (GLUCOPHAGE) 1000 MG tablet Take 1 tablet (1,000 mg total) by mouth daily.   metoprolol succinate (TOPROL-XL) 50 MG 24 hr tablet Take 1 tablet (50 mg total) by mouth daily. TAKE WITH OR IMMEDIATELY FOLLOWING A MEAL.   nitroGLYCERIN (NITROSTAT) 0.4 MG SL tablet Place 1 tablet (0.4 mg total) under the tongue every 5 (five) minutes x 3 doses as needed for chest pain.   ondansetron (ZOFRAN-ODT) 4 MG disintegrating tablet Take 4 mg by mouth every 8 (eight) hours as needed for nausea or vomiting.   pantoprazole (PROTONIX) 40 MG tablet Take 40 mg by mouth daily.   Probiotic Product (PROBIOTIC PO) Take 1 capsule by mouth in the morning.   [DISCONTINUED] empagliflozin (JARDIANCE) 10 MG TABS tablet Take 1 tablet (10  mg total) by mouth daily before breakfast.   rosuvastatin (CRESTOR) 5 MG tablet Take 1 tablet (5 mg total) by mouth daily. (Patient taking differently: Take 5 mg by mouth daily after lunch.)   Facility-Administered Encounter Medications as of 09/21/2022  Medication   sodium chloride flush (NS) 0.9 % injection 3 mL     Past Surgical History:  Procedure Laterality Date   CIRCUMCISION     COLONOSCOPY     COLONOSCOPY  06/25/2016   [place], [place] by [name]   CORONARY PRESSURE/FFR STUDY N/A 07/25/2020   Procedure: INTRAVASCULAR PRESSURE WIRE/FFR STUDY;  Surgeon: [name], MD;  Location: [facility] INVASIVE CV LAB;  Service: Cardiovascular;  Laterality: N/A;   LEFT HEART CATH AND CORONARY ANGIOGRAPHY N/A 07/25/2020   Procedure: LEFT HEART CATH AND CORONARY ANGIOGRAPHY;  Surgeon: [name], MD;  Location: [facility] INVASIVE CV LAB;  Service: Cardiovascular;  Laterality: N/A;   meniscus left knee     MENISCUS REPAIR Left 05/28/2017   [place], [place] by Dr. [name]   TONSILLECTOMY  1953   [place], [place]    Review of Systems  Constitutional:  Negative for chills and fever.  Eyes:  Negative for blurred vision.  Respiratory:  Negative for shortness of breath.   Cardiovascular:  Negative for chest pain.  Gastrointestinal:  Negative for abdominal pain.  Genitourinary:  Negative for dysuria.  Musculoskeletal:  Negative for myalgias.  Neurological:  Positive for dizziness. Negative for tremors and headaches.  Endo/Heme/Allergies:  Negative for polydipsia and polyphagia.  Psychiatric/Behavioral:  Negative for confusion.       Objective    BP 122/71   Pulse 69   Ht 5\' 10"  (1.778 m)   Wt 242 lb 1.9 oz (109.8 kg)   SpO2 94%   BMI 34.74 kg/m   Physical Exam Vitals reviewed.  Constitutional:      General: He is not in acute distress.    Appearance: Normal appearance. He is not ill-appearing, toxic-appearing or diaphoretic.  HENT:     Head: Normocephalic.  Eyes:     General:        Right eye: No discharge.        Left eye: No discharge.     Conjunctiva/sclera: Conjunctivae normal.  Cardiovascular:     Rate and Rhythm: Normal rate.     Pulses: Normal pulses.     Heart sounds: Normal heart sounds.  Pulmonary:     Effort: Pulmonary effort is normal. No respiratory distress.     Breath sounds:  Normal breath sounds.  Abdominal:     General: Bowel sounds are normal.     Palpations: Abdomen is soft.     Tenderness: There is no abdominal tenderness. There is no right CVA tenderness, left CVA tenderness or guarding.  Musculoskeletal:        General: Normal range of motion.     Cervical back: Normal range of motion.  Skin:    General: Skin is warm and dry.     Capillary Refill: Capillary refill takes less than 2 seconds.  Neurological:     General: No focal deficit present.     Mental Status: He is alert and oriented to person, place, and time.     Coordination: Coordination normal.     Gait: Gait normal.  Psychiatric:        Mood and Affect: Mood normal.        Behavior: Behavior normal.       Assessment & Plan:  Type  2 diabetes mellitus without complication, without long-term current use of insulin (HCC) -     Hemoglobin A1c  Primary hypertension -     Basic metabolic panel  Mixed hyperlipidemia -     Lipid panel  Diabetes mellitus without complication (HCC) Assessment & Plan: Hemoglobin A1C 7.6 Labs ordered today awaiting results will follow up. Patient reporting taking Metformin 1000 mg daily and Glimepiride 1 mg daily.                 Nonpharmacological interventions such as low carb diet,high in protein, vegetables and fruit discussed. Educated on importance of physical activity 150 minutes per week. Discussed signs and symptoms of hypoglycemia, & hyperglycemia and need to present to the ED if symptoms occurs.Follow up in 3 months or sooner if needed. Patient verbalizes understanding regarding plan of care and all questions answered. Ophthalmology current , Foot exam within desired limits    Essential hypertension Assessment & Plan: Vitals:   09/21/22 0848  BP: 122/71  Blood pressure reading controlled in today's visit. Labs ordered. Continue amlodipine 5 mg, Lisinopril 10 mg , Metoprolol 50 mg daily Continued discussion on DASH diet, low sodium diet and  maintain a exercise routine for 150 minutes per week.      Return in about 3 months (around 12/22/2022), or if symptoms worsen or fail to improve, for type 2 diabetes, hypertension, routine labs.   Cruzita Lederer Newman Nip, FNP

## 2022-09-21 NOTE — Patient Instructions (Signed)

## 2022-09-21 NOTE — Assessment & Plan Note (Addendum)
Vitals:   09/21/22 0848  BP: 122/71  Blood pressure reading controlled in today's visit. Labs ordered. Continue amlodipine 5 mg, Lisinopril 10 mg , Metoprolol 50 mg daily Continued discussion on DASH diet, low sodium diet and maintain a exercise routine for 150 minutes per week.

## 2022-09-21 NOTE — Assessment & Plan Note (Signed)
Hemoglobin A1C 7.6 Labs ordered today awaiting results will follow up. Patient reporting taking Metformin 1000 mg daily and Glimepiride 1 mg daily.                 Nonpharmacological interventions such as low carb diet,high in protein, vegetables and fruit discussed. Educated on importance of physical activity 150 minutes per week. Discussed signs and symptoms of hypoglycemia, & hyperglycemia and need to present to the ED if symptoms occurs.Follow up in 3 months or sooner if needed. Patient verbalizes understanding regarding plan of care and all questions answered. Ophthalmology current , Foot exam within desired limits

## 2022-09-22 LAB — BASIC METABOLIC PANEL
BUN/Creatinine Ratio: 15 (ref 10–24)
BUN: 17 mg/dL (ref 8–27)
CO2: 22 mmol/L (ref 20–29)
Calcium: 10 mg/dL (ref 8.6–10.2)
Chloride: 102 mmol/L (ref 96–106)
Creatinine, Ser: 1.12 mg/dL (ref 0.76–1.27)
Glucose: 172 mg/dL — ABNORMAL HIGH (ref 70–99)
Potassium: 5.4 mmol/L — ABNORMAL HIGH (ref 3.5–5.2)
Sodium: 136 mmol/L (ref 134–144)
eGFR: 69 mL/min/{1.73_m2} (ref >59)

## 2022-09-22 LAB — LIPID PANEL
Chol/HDL Ratio: 3.1 ratio (ref 0.0–5.0)
Cholesterol, Total: 98 mg/dL — ABNORMAL LOW (ref 100–199)
HDL: 32 mg/dL — ABNORMAL LOW (ref >39)
LDL Chol Calc (NIH): 34 mg/dL (ref 0–99)
Triglycerides: 196 mg/dL — ABNORMAL HIGH (ref 0–149)
VLDL Cholesterol Cal: 32 mg/dL (ref 5–40)

## 2022-09-22 LAB — HEMOGLOBIN A1C
Est. average glucose Bld gHb Est-mCnc: 157 mg/dL
Hgb A1c MFr Bld: 7.1 % — ABNORMAL HIGH (ref 4.8–5.6)

## 2022-09-23 LAB — THYROGLOBULIN ANTIBODY: Thyroglobulin Antibody: 35.3 IU/mL — ABNORMAL HIGH (ref 0.0–0.9)

## 2022-09-23 LAB — TSH: TSH: 1.86 u[IU]/mL (ref 0.450–4.500)

## 2022-09-23 LAB — THYROID PEROXIDASE ANTIBODY: Thyroperoxidase Ab SerPl-aCnc: <9 IU/mL (ref 0–34)

## 2022-09-23 LAB — T4, FREE: Free T4: 1.28 ng/dL (ref 0.82–1.77)

## 2022-09-24 ENCOUNTER — Other Ambulatory Visit: Payer: Self-pay | Admitting: Family Medicine

## 2022-09-24 MED ORDER — ROSUVASTATIN CALCIUM 20 MG PO TABS: 20.0000 mg | ORAL_TABLET | Freq: Every day | ORAL | 3 refills | Status: DC

## 2022-09-24 NOTE — Progress Notes (Signed)
    Cardiology Office Note  Date: 09/25/2022   ID: Bradley Acevedo, DOB [birth year], MRN [id number]  History of Present Illness: Bradley Acevedo is a 77 y.o. male last seen in May by Ms. Strader PA-C, I reviewed the note. He is here for a routine visit. Reports resolution of angina on Imdur 60 mg BID. Stable NYHA class II dyspnea. No palpitations or syncope.  Blood pressures running 120-130 at home. He reports compliance with his medications which we reviewed today.  He was not able to afford Jardiance although [facility] on [facility] formulary.  We discussed his recent Myoview and will plan on medical therapy unless symptoms are not able to be controlled.  Physical Exam: VS:  BP (!) 146/70 (BP Location: Right Arm, Patient Position: Sitting, Cuff Size: Normal)   Pulse 91   Ht 5\' 10"  (1.778 m)   Wt 244 lb 12.8 oz (111 kg)   SpO2 97%   BMI 35.13 kg/m , BMI Body mass index is 35.13 kg/m.  Wt Readings from Last 3 Encounters:  09/25/22 244 lb 12.8 oz (111 kg)  09/21/22 242 lb 1.9 oz (109.8 kg)  07/14/22 246 lb (111.6 kg)    General: Patient appears comfortable at rest. HEENT: Conjunctiva and lids normal. Lungs: Clear to auscultation, nonlabored breathing at rest. Cardiac: Regular rate and rhythm, no S3 or significant systolic murmur. Extremities: No pitting edema.  ECG:  An ECG dated 07/12/2022 was personally reviewed today and demonstrated:  Sinus rhythm with nonspecific ST changes.  Labwork: 06/19/2022: ALT 29; AST 24 07/12/2022: Hemoglobin 12.5; Platelets 179 09/21/2022: BUN 17; Creatinine, Ser 1.12; Potassium 5.4; Sodium 136; TSH 1.860     Component Value Date/Time   CHOL 98 (L) 09/21/2022 1016   TRIG 196 (H) 09/21/2022 1016   HDL 32 (L) 09/21/2022 1016   CHOLHDL 3.1 09/21/2022 1016   CHOLHDL 3.4 06/28/2020 1035   VLDL 20 06/28/2020 1035   LDLCALC 34 09/21/2022 1016   Other Studies Reviewed Today:  Lexiscan Myoview  07/23/2022:   Stress ECG is negative for ischemia and arrythmias.   LV perfusion is abnormal. There is evidence of ischemia. There is no evidence of infarction. There is a small reversible perfusion defect with moderate reduction in uptake present in the apex consistent with ischemia.   Left ventricular function is normal. Nuclear stress EF: 58 %.   Findings are consistent with ischemia. The study is low risk.  Assessment and Plan:  1.  CAD with moderate mid LAD stenosis by cardiac catheterization in May 2022 managed medically in light of borderline FFR.  Follow-up Lexiscan Myoview in May was low risk showing a small apical ischemic territory with LVEF 58%. Angina has resolved with increase in Imdur to 60 mg BID. Continue ASA, Toprol XL, Norvasc, and Crestor. As needed NTG available. Plan 6 month follow-up, sooner if needed.  2.  Essential hypertension. Continue lisinopril as well. Continue to track blood pressure at home.  3.  Mixed hyperlipidemia.  Recent LDL 34. No change in Crestor.  Disposition:  Follow up  6 months, sooner if needed.  Signed, Jonelle Sidle, M.D., F.A.C.C. [facility]

## 2022-09-25 ENCOUNTER — Other Ambulatory Visit: Payer: Self-pay | Admitting: Family Medicine

## 2022-09-25 ENCOUNTER — Ambulatory Visit: Payer: Medicare Other | Attending: Cardiology | Admitting: Cardiology

## 2022-09-25 ENCOUNTER — Encounter: Payer: Self-pay | Admitting: Cardiology

## 2022-09-25 VITALS — BP 146/70 | HR 91 | Ht 70.0 in | Wt 244.8 lb

## 2022-09-25 DIAGNOSIS — E782 Mixed hyperlipidemia: Secondary | ICD-10-CM | POA: Insufficient documentation

## 2022-09-25 DIAGNOSIS — I10 Essential (primary) hypertension: Secondary | ICD-10-CM | POA: Diagnosis present

## 2022-09-25 DIAGNOSIS — I25119 Atherosclerotic heart disease of native coronary artery with unspecified angina pectoris: Secondary | ICD-10-CM | POA: Diagnosis present

## 2022-09-25 NOTE — Patient Instructions (Signed)
Medication Instructions:  Your physician recommends that you continue on your current medications as directed. Please refer to the Current Medication list given to you today.   Labwork: None today  Testing/Procedures: None today  Follow-Up: 6 months  Any Other Special Instructions Will Be Listed Below (If Applicable).  If you need a refill on your cardiac medications before your next appointment, please call your pharmacy.  

## 2022-10-05 ENCOUNTER — Encounter: Payer: Self-pay | Admitting: "Endocrinology

## 2022-10-05 ENCOUNTER — Ambulatory Visit (INDEPENDENT_AMBULATORY_CARE_PROVIDER_SITE_OTHER): Payer: Medicare Other | Admitting: "Endocrinology

## 2022-10-05 VITALS — BP 108/56 | HR 84 | Ht 70.0 in | Wt 246.0 lb

## 2022-10-05 DIAGNOSIS — E782 Mixed hyperlipidemia: Secondary | ICD-10-CM | POA: Diagnosis not present

## 2022-10-05 DIAGNOSIS — E063 Autoimmune thyroiditis: Secondary | ICD-10-CM

## 2022-10-05 DIAGNOSIS — E038 Other specified hypothyroidism: Secondary | ICD-10-CM | POA: Diagnosis not present

## 2022-10-05 NOTE — Progress Notes (Signed)
Endocrinology follow-up note                                             10/05/2022, 7:21 PM   Subjective:    Patient ID: Bradley Acevedo, male    DOB: 1945/05/10, PCP Del Nigel Berthold, FNP   Past Medical History:  Diagnosis Date   Acid reflux    CAD (coronary artery disease)    Moderate LAD disease May 2022 managed medically   Diverticulosis    Hypertension    Hypothyroid    IBS (irritable bowel syndrome)    Type 2 diabetes mellitus [facility])    Past Surgical History:  Procedure Laterality Date   CIRCUMCISION     COLONOSCOPY     COLONOSCOPY  06/25/2016   [place], [place] by [name]   CORONARY PRESSURE/FFR STUDY N/A 07/25/2020   Procedure: INTRAVASCULAR PRESSURE WIRE/FFR STUDY;  Surgeon: [name], MD;  Location: [facility] INVASIVE CV LAB;  Service: Cardiovascular;  Laterality: N/A;   LEFT HEART CATH AND CORONARY ANGIOGRAPHY N/A 07/25/2020   Procedure: LEFT HEART CATH AND CORONARY ANGIOGRAPHY;  Surgeon: [name], MD;  Location: [facility] INVASIVE CV LAB;  Service: Cardiovascular;  Laterality: N/A;   meniscus left knee     MENISCUS REPAIR Left 05/28/2017   [place], [place] by Dr. [name]   TONSILLECTOMY  1953   [place], [place]   Social History   Socioeconomic History   Marital status: Married    Spouse name: Not on file   Number of children: Not on file   Years of education: Not on file   Highest education level: Associate degree: occupational, Scientist, product/process development, or vocational program  Occupational History   Not on file  Tobacco Use   Smoking status: Never   Smokeless tobacco: Never  Vaping Use   Vaping status: Never Used  Substance and Sexual Activity   Alcohol use: Yes    Comment: rarely   Drug use: Not Currently   Sexual activity: Not on file  Other Topics Concern   Not on file  Social History Narrative   Not on file   Social Determinants of Health   Financial Resource Strain: Low Risk  (06/18/2022)   Overall Financial Resource Strain (CARDIA)     Difficulty of Paying Living Expenses: Not hard at all  Food Insecurity: No Food Insecurity (06/18/2022)   Hunger Vital Sign    Worried About Running Out of Food in the Last Year: Never true    Ran Out of Food in the Last Year: Never true  Transportation Needs: No Transportation Needs (06/18/2022)   PRAPARE - Administrator, Civil Service (Medical): No    Lack of Transportation (Non-Medical): No  Physical Activity: Insufficiently Active (06/18/2022)   Exercise Vital Sign    Days of Exercise per Week: 2 days    Minutes of Exercise per Session: 50 min  Stress: No Stress Concern Present (06/18/2022)   [employer] of Occupational Health - Occupational Stress Questionnaire    Feeling of Stress : Only a little  Social Connections: Socially Integrated (06/18/2022)   Social Connection and Isolation Panel [NHANES]    Frequency of Communication with Friends and Family: More than three times a week    Frequency of Social Gatherings with Friends and Family: More than three times a week    Attends Religious Services: More  than 4 times per year    Active Member of Clubs or Organizations: Yes    Attends Banker Meetings: More than 4 times per year    Marital Status: Married   Family History  Problem Relation Age of Onset   Cancer Mother    Hypertension Mother    Hyperlipidemia Mother    Hyperlipidemia Father    Hypertension Father    Outpatient Encounter Medications as of 10/05/2022  Medication Sig   Omega-3 Fatty Acids (FISH OIL) 1000 MG CAPS Take 1 capsule by mouth daily.   amLODipine (NORVASC) 5 MG tablet Take 0.5 tablets (2.5 mg total) by mouth daily.   aspirin EC 81 MG EC tablet Take 1 tablet (81 mg total) by mouth daily. Swallow whole. (Patient taking differently: Take 81 mg by mouth in the morning. Swallow whole.)   Cholecalciferol (D3 DOTS) 50 MCG (2000 UT) TBDP Take 2,000 Units by mouth in the morning.   diphenhydrAMINE (BENADRYL) 25 MG tablet Take 25 mg by  mouth daily.   glimepiride (AMARYL) 1 MG tablet Take 1 tablet (1 mg total) by mouth daily with breakfast.   isosorbide mononitrate (IMDUR) 60 MG 24 hr tablet Take 1 tablet (60 mg total) by mouth in the morning and at bedtime.   levothyroxine (SYNTHROID) 50 MCG tablet Take 50 mcg by mouth daily before breakfast.   lisinopril (ZESTRIL) 10 MG tablet Take 1 tablet (10 mg total) by mouth every morning.   metFORMIN (GLUCOPHAGE) 1000 MG tablet Take 1 tablet (1,000 mg total) by mouth daily.   metoprolol succinate (TOPROL-XL) 50 MG 24 hr tablet Take 1 tablet (50 mg total) by mouth daily. TAKE WITH OR IMMEDIATELY FOLLOWING A MEAL.   nitroGLYCERIN (NITROSTAT) 0.4 MG SL tablet Place 1 tablet (0.4 mg total) under the tongue every 5 (five) minutes x 3 doses as needed for chest pain.   ondansetron (ZOFRAN-ODT) 4 MG disintegrating tablet Take 4 mg by mouth every 8 (eight) hours as needed for nausea or vomiting.   pantoprazole (PROTONIX) 40 MG tablet Take 40 mg by mouth daily.   Probiotic Product (PROBIOTIC PO) Take 1 capsule by mouth in the morning.   rosuvastatin (CRESTOR) 20 MG tablet Take 1 tablet (20 mg total) by mouth daily.   Facility-Administered Encounter Medications as of 10/05/2022  Medication   sodium chloride flush (NS) 0.9 % injection 3 mL   ALLERGIES: No Known Allergies  VACCINATION STATUS: Immunization History  Administered Date(s) Administered   Influenza Split 01/07/2017   Influenza-Unspecified 12/26/2019, 12/02/2020   Moderna Covid-19 Vaccine Bivalent Booster 2yrs & up 12/30/2020   Moderna Sars-Covid-2 Vaccination 04/20/2019, 05/18/2019, 01/11/2020, 05/16/2020   Pneumococcal Conjugate-13 07/13/2016   Pneumococcal Polysaccharide-23 12/22/2018   Tdap 02/07/2011, 02/17/2021   Zoster Recombinant(Shingrix) 04/20/2019, 05/18/2019, 07/03/2019, 09/04/2019    HPI her Bradley Acevedo is 77 y.o. male who presents today with a medical history as above. he is being seen in consultation for  hypothyroidism requested by Del Nigel Berthold, FNP.   History is obtained directly from the patient and chart review.  He reports that he was diagnosed with hypothyroidism at approximate age of 69.  He was given levothyroxine as thyroid hormone replacement.  He does not recall if this dose was adjusted ever.  His most recent thyroid function tests are consistent with appropriate replacement.   He presents with steady weight.  He has no acute complaints today.   He denies palpitations, tremors, nor heat intolerance.  He denies dysphagia, shortness  of breath nor voice change. He denies any family history of thyroid dysfunction or thyroid malignancy. His other medical problems include type 2 diabetes on metformin, hyperlipidemia on Crestor and hypertension on amlodipine, metoprolol, lisinopril.   Review of Systems  Constitutional: no recent weight gain/loss, no fatigue, no subjective hyperthermia, no subjective hypothermia Eyes: no blurry vision, no xerophthalmia ENT: no sore throat, no nodules palpated in throat, no dysphagia/odynophagia, no hoarseness   Objective:       10/05/2022    2:45 PM 09/25/2022    8:34 AM 09/21/2022    8:48 AM  Vitals with BMI  Height 5\' 10"  5\' 10"  5\' 10"   Weight 246 lbs 244 lbs 13 oz 242 lbs 2 oz  BMI 35.3 35.13 34.74  Systolic 108 146 563  Diastolic 56 70 71  Pulse 84 91 69    BP (!) 108/56   Pulse 84   Ht 5\' 10"  (1.778 m)   Wt 246 lb (111.6 kg)   BMI 35.30 kg/m   Wt Readings from Last 3 Encounters:  10/05/22 246 lb (111.6 kg)  09/25/22 244 lb 12.8 oz (111 kg)  09/21/22 242 lb 1.9 oz (109.8 kg)    Physical Exam  Constitutional:  Body mass index is 35.3 kg/m.,  not in acute distress, normal state of mind Eyes: PERRLA, EOMI, no exophthalmos ENT: moist mucous membranes, no gross thyromegaly, no gross cervical lymphadenopathy   CMP ( most recent) CMP     Component Value Date/Time   NA 136 09/21/2022 1016   K 5.4 (H) 09/21/2022 1016   CL  102 09/21/2022 1016   CO2 22 09/21/2022 1016   GLUCOSE 172 (H) 09/21/2022 1016   GLUCOSE 204 (H) 07/12/2022 1025   BUN 17 09/21/2022 1016   CREATININE 1.12 09/21/2022 1016   CALCIUM 10.0 09/21/2022 1016   PROT 6.4 06/19/2022 1141   ALBUMIN 4.4 06/19/2022 1141   AST 24 06/19/2022 1141   ALT 29 06/19/2022 1141   ALKPHOS 63 06/19/2022 1141   BILITOT 0.3 06/19/2022 1141   GFRNONAA >60 07/12/2022 1025     Diabetic Labs (most recent): Lab Results  Component Value Date   HGBA1C 7.1 (H) 09/21/2022   HGBA1C 7.6 (H) 06/19/2022   HGBA1C 8.9 (H) 03/19/2022     Lipid Panel ( most recent) Lipid Panel     Component Value Date/Time   CHOL 98 (L) 09/21/2022 1016   TRIG 196 (H) 09/21/2022 1016   HDL 32 (L) 09/21/2022 1016   CHOLHDL 3.1 09/21/2022 1016   CHOLHDL 3.4 06/28/2020 1035   VLDL 20 06/28/2020 1035   LDLCALC 34 09/21/2022 1016   LABVLDL 32 09/21/2022 1016      Lab Results  Component Value Date   TSH 1.860 09/21/2022   TSH 5.440 (H) 03/19/2022   FREET4 1.28 09/21/2022   FREET4 1.13 03/19/2022         Assessment & Plan:   1. Hypothyroidism 2.  Hashimoto's thyroiditis 3.  Hyperlipidemia  - Cung MUSA SORICH  is being seen at a kind request of Del Newman Nip, Tenna Child, FNP. - I have reviewed his available thyroid records and clinically evaluated the patient. - Based on these reviews, he has hypothyroidism due to Hashimoto's thyroiditis with evidence of appropriate replacement. -Advised him to continue levothyroxine 50 mcg p.o. daily before breakfast.    - We discussed about the correct intake of his thyroid hormone, on empty stomach at fasting, with water, separated by at least 30 minutes from breakfast  and other medications,  and separated by more than 4 hours from calcium, iron, multivitamins, acid reflux medications (PPIs). -Patient is made aware of the fact that thyroid hormone replacement is needed for life, dose to be adjusted by periodic monitoring of thyroid  function tests.  In the absence of clinical goiter, he will not need thyroid imaging at this time. He is advised to maintain close follow-up with his PMD for management of diabetes, hypertension and hyperlipidemia.  He is advised to continue Crestor 20 mg p.o. nightly. - he is advised to maintain close follow up with Del Nigel Berthold, FNP for primary care needs.   I spent  20  minutes in the care of the patient today including review of labs from Thyroid Function, CMP, and other relevant labs ; imaging/biopsy records (current and previous including abstractions from other facilities); face-to-face time discussing  his lab results and symptoms, medications doses, his options of short and long term treatment based on the latest standards of care / guidelines;   and documenting the encounter.  Bradley Acevedo  participated in the discussions, expressed understanding, and voiced agreement with the above plans.  All questions were answered to his satisfaction. he is encouraged to contact clinic should he have any questions or concerns prior to his return visit.  Follow up plan: Return in about 6 months (around 04/07/2023) for F/U with Pre-visit Labs.   Marquis Lunch, MD [facility] [address] [place], [place] [address] Phone: [phone]  Fax: [phone]     10/05/2022, 7:21 PM  This note was partially dictated with voice recognition software. Similar sounding words can be transcribed inadequately or may not  be corrected upon review.

## 2022-10-06 ENCOUNTER — Ambulatory Visit: Payer: Medicare Other | Admitting: Student

## 2022-11-13 ENCOUNTER — Other Ambulatory Visit: Payer: Self-pay | Admitting: Cardiology

## 2022-11-23 ENCOUNTER — Other Ambulatory Visit: Payer: Self-pay | Admitting: "Endocrinology

## 2022-11-23 ENCOUNTER — Other Ambulatory Visit: Payer: Self-pay | Admitting: Family Medicine

## 2022-12-22 ENCOUNTER — Encounter: Payer: Self-pay | Admitting: Family Medicine

## 2022-12-22 ENCOUNTER — Ambulatory Visit: Payer: Medicare Other | Admitting: Family Medicine

## 2022-12-22 VITALS — BP 136/72 | HR 78 | Ht 70.0 in | Wt 249.1 lb

## 2022-12-22 DIAGNOSIS — E038 Other specified hypothyroidism: Secondary | ICD-10-CM

## 2022-12-22 DIAGNOSIS — I10 Essential (primary) hypertension: Secondary | ICD-10-CM | POA: Diagnosis not present

## 2022-12-22 DIAGNOSIS — Z23 Encounter for immunization: Secondary | ICD-10-CM | POA: Diagnosis not present

## 2022-12-22 DIAGNOSIS — E1159 Type 2 diabetes mellitus with other circulatory complications: Secondary | ICD-10-CM

## 2022-12-22 DIAGNOSIS — E119 Type 2 diabetes mellitus without complications: Secondary | ICD-10-CM

## 2022-12-22 NOTE — Progress Notes (Signed)
Patient Office Visit   Subjective   Patient ID: Bradley Acevedo, male    DOB: 1945-11-28  Age: 77 y.o. MRN: [id number]  CC:  Chief Complaint  Patient presents with   Care Management    3 month f/u, wants a flu shot today.    HPI Bradley Acevedo 77 year old male, presents to the clinic for HTN and type 2 diabetes management. He  has a past medical history of Acid reflux, CAD (coronary artery disease), Diverticulosis, Hypertension, Hypothyroid, IBS (irritable bowel syndrome), and Type 2 diabetes mellitus (HCC). For the details of today's visit, please refer to assessment and plan.   HPI    Outpatient Encounter Medications as of 12/22/2022  Medication Sig   amLODipine (NORVASC) 5 MG tablet TAKE 1/2 TABLET BY MOUTH ONCE DAILY   aspirin EC 81 MG EC tablet Take 1 tablet (81 mg total) by mouth daily. Swallow whole. (Patient taking differently: Take 81 mg by mouth in the morning. Swallow whole.)   Cholecalciferol (D3 DOTS) 50 MCG (2000 UT) TBDP Take 2,000 Units by mouth in the morning.   diphenhydrAMINE (BENADRYL) 25 MG tablet Take 25 mg by mouth daily.   glimepiride (AMARYL) 1 MG tablet TAKE 1 TABLET DAILY WITH   BREAKFAST   levothyroxine (SYNTHROID) 50 MCG tablet Take 50 mcg by mouth daily before breakfast.   levothyroxine (SYNTHROID) 50 MCG tablet Take 1 tablet (50 mcg total) by mouth daily.   lisinopril (ZESTRIL) 10 MG tablet Take 1 tablet (10 mg total) by mouth every morning.   metFORMIN (GLUCOPHAGE) 1000 MG tablet Take 1 tablet (1,000 mg total) by mouth daily.   metoprolol succinate (TOPROL-XL) 50 MG 24 hr tablet Take 1 tablet (50 mg total) by mouth daily. TAKE WITH OR IMMEDIATELY FOLLOWING A MEAL.   nitroGLYCERIN (NITROSTAT) 0.4 MG SL tablet Place 1 tablet (0.4 mg total) under the tongue every 5 (five) minutes x 3 doses as needed for chest pain.   Omega-3 Fatty Acids (FISH OIL) 1000 MG CAPS Take 1 capsule by mouth daily.   ondansetron (ZOFRAN-ODT) 4 MG disintegrating tablet Take 4  mg by mouth every 8 (eight) hours as needed for nausea or vomiting.   pantoprazole (PROTONIX) 40 MG tablet Take 40 mg by mouth daily.   Probiotic Product (PROBIOTIC PO) Take 1 capsule by mouth in the morning.   rosuvastatin (CRESTOR) 20 MG tablet Take 1 tablet (20 mg total) by mouth daily.   isosorbide mononitrate (IMDUR) 60 MG 24 hr tablet Take 1 tablet (60 mg total) by mouth in the morning and at bedtime.   Facility-Administered Encounter Medications as of 12/22/2022  Medication   sodium chloride flush (NS) 0.9 % injection 3 mL    Past Surgical History:  Procedure Laterality Date   CIRCUMCISION     COLONOSCOPY     COLONOSCOPY  06/25/2016   [place], [place] by [name]   CORONARY PRESSURE/FFR STUDY N/A 07/25/2020   Procedure: INTRAVASCULAR PRESSURE WIRE/FFR STUDY;  Surgeon: [name], MD;  Location: [facility] INVASIVE CV LAB;  Service: Cardiovascular;  Laterality: N/A;   LEFT HEART CATH AND CORONARY ANGIOGRAPHY N/A 07/25/2020   Procedure: LEFT HEART CATH AND CORONARY ANGIOGRAPHY;  Surgeon: [name], MD;  Location: [facility] INVASIVE CV LAB;  Service: Cardiovascular;  Laterality: N/A;   meniscus left knee     MENISCUS REPAIR Left 05/28/2017   [place], [place] by Dr. [name]   TONSILLECTOMY  1953   [place], [place]    Review of  Systems  Constitutional:  Negative for chills and fever.  Eyes:  Negative for blurred vision.  Respiratory:  Negative for shortness of breath.   Cardiovascular:  Negative for chest pain.  Gastrointestinal:  Negative for abdominal pain.  Neurological:  Negative for dizziness and headaches.      Objective    BP 136/72   Pulse 78   Ht 5\' 10"  (1.778 m)   Wt 249 lb 1.3 oz (113 kg)   SpO2 97%   BMI 35.74 kg/m   Physical Exam Vitals reviewed.  Constitutional:      General: He is not in acute distress.    Appearance: Normal appearance. He is not ill-appearing, toxic-appearing or diaphoretic.  HENT:     Head: Normocephalic.  Eyes:     General:         Right eye: No discharge.        Left eye: No discharge.     Conjunctiva/sclera: Conjunctivae normal.  Cardiovascular:     Rate and Rhythm: Normal rate.     Pulses: Normal pulses.     Heart sounds: Normal heart sounds.  Pulmonary:     Effort: Pulmonary effort is normal. No respiratory distress.     Breath sounds: Normal breath sounds.  Abdominal:     General: Bowel sounds are normal.     Palpations: Abdomen is soft.     Tenderness: There is no abdominal tenderness. There is no right CVA tenderness, left CVA tenderness or guarding.  Musculoskeletal:        General: Normal range of motion.     Cervical back: Normal range of motion.  Skin:    General: Skin is warm and dry.     Capillary Refill: Capillary refill takes less than 2 seconds.  Neurological:     Mental Status: He is alert.     Coordination: Coordination normal.     Gait: Gait normal.  Psychiatric:        Mood and Affect: Mood normal.        Behavior: Behavior normal.       Assessment & Plan:  Primary hypertension -     BMP8+eGFR -     Lipid panel -     CBC with Differential/Platelet  Type 2 diabetes mellitus without complication, without long-term current use of insulin (HCC) -     Hemoglobin A1c  TSH (thyroid-stimulating hormone deficiency)  Encounter for immunization -     Flu Vaccine Trivalent High Dose (Fluad)  Essential hypertension Assessment & Plan: Vitals:   12/22/22 0913  BP: 136/72    Blood pressure reading controlled in today's visit. Labs ordered. Continue amlodipine 5 mg, Lisinopril 10 mg , Metoprolol 50 mg daily  Labs ordered. Discussed with  patient to monitor their blood pressure regularly and maintain a heart-healthy diet rich in fruits, vegetables, whole grains, and low-fat dairy, while reducing sodium intake to less than 2,300 mg per day. Regular physical activity, such as 30 minutes of moderate exercise most days of the week, will help lower blood pressure and improve overall  cardiovascular health. Avoiding smoking, limiting alcohol consumption, and managing stress. Take  prescribed medication, & take it as directed and avoid skipping doses. Seek emergency care if your blood pressure is (over 180/120) or you experience chest pain, shortness of breath, or sudden vision changes.Patient verbalizes understanding regarding plan of care and all questions answered.      Diabetes mellitus without complication [facility]) Assessment & Plan: Last Hemoglobin A1C 7.1 on 09/21/22,  Labs ordered today awaiting results will follow up. Patient reports taking glimepiride 1 mg daily and metformin 1000 mg daily.Discussed  Nonpharmacological interventions such as low carb diet,high in protein, vegetables and fruit discussed. Educated on importance of physical activity 150 minutes per week. Discussed signs and symptoms of hypoglycemia, & hyperglycemia and need to present to the ED if symptoms occurs.Follow up in 3 months or sooner if needed. Patient verbalizes understanding regarding plan of care and all questions answered. Ophthalmology exam current , Foot exam within desired limits      Return in about 4 months (around 04/24/2023), or if symptoms worsen or fail to improve, for chronic follow-up.   Cruzita Lederer Newman Nip, FNP

## 2022-12-22 NOTE — Patient Instructions (Signed)

## 2022-12-22 NOTE — Assessment & Plan Note (Signed)
Vitals:   12/22/22 0913  BP: 136/72    Blood pressure reading controlled in today's visit. Labs ordered. Continue amlodipine 5 mg, Lisinopril 10 mg , Metoprolol 50 mg daily  Labs ordered. Discussed with  patient to monitor their blood pressure regularly and maintain a heart-healthy diet rich in fruits, vegetables, whole grains, and low-fat dairy, while reducing sodium intake to less than 2,300 mg per day. Regular physical activity, such as 30 minutes of moderate exercise most days of the week, will help lower blood pressure and improve overall cardiovascular health. Avoiding smoking, limiting alcohol consumption, and managing stress. Take  prescribed medication, & take it as directed and avoid skipping doses. Seek emergency care if your blood pressure is (over 180/120) or you experience chest pain, shortness of breath, or sudden vision changes.Patient verbalizes understanding regarding plan of care and all questions answered.

## 2022-12-22 NOTE — Assessment & Plan Note (Addendum)
Last Hemoglobin A1C 7.1 on 09/21/22, Labs ordered today awaiting results will follow up. Patient reports taking glimepiride 1 mg daily and metformin 1000 mg daily.Discussed  Nonpharmacological interventions such as low carb diet,high in protein, vegetables and fruit discussed. Educated on importance of physical activity 150 minutes per week. Discussed signs and symptoms of hypoglycemia, & hyperglycemia and need to present to the ED if symptoms occurs.Follow up in 3 months or sooner if needed. Patient verbalizes understanding regarding plan of care and all questions answered. Ophthalmology exam current , Foot exam within desired limits

## 2022-12-23 LAB — BMP8+EGFR
BUN/Creatinine Ratio: 17 (ref 10–24)
BUN: 18 mg/dL (ref 8–27)
CO2: 22 mmol/L (ref 20–29)
Calcium: 9.7 mg/dL (ref 8.6–10.2)
Chloride: 103 mmol/L (ref 96–106)
Creatinine, Ser: 1.07 mg/dL (ref 0.76–1.27)
Glucose: 149 mg/dL — ABNORMAL HIGH (ref 70–99)
Potassium: 5 mmol/L (ref 3.5–5.2)
Sodium: 138 mmol/L (ref 134–144)
eGFR: 72 mL/min/{1.73_m2} (ref >59)

## 2022-12-23 LAB — CBC WITH DIFFERENTIAL/PLATELET
Basophils Absolute: 0.1 10*3/uL (ref 0.0–0.2)
Basos: 2 %
EOS (ABSOLUTE): 0.2 10*3/uL (ref 0.0–0.4)
Eos: 3 %
Hematocrit: 40.3 % (ref 37.5–51.0)
Hemoglobin: 13.3 g/dL (ref 13.0–17.7)
Immature Grans (Abs): 0 10*3/uL (ref 0.0–0.1)
Immature Granulocytes: 1 %
Lymphocytes Absolute: 1.6 10*3/uL (ref 0.7–3.1)
Lymphs: 27 %
MCH: 30.2 pg (ref 26.6–33.0)
MCHC: 33 g/dL (ref 31.5–35.7)
MCV: 91 fL (ref 79–97)
Monocytes Absolute: 0.4 10*3/uL (ref 0.1–0.9)
Monocytes: 7 %
Neutrophils Absolute: 3.5 10*3/uL (ref 1.4–7.0)
Neutrophils: 60 %
Platelets: 187 10*3/uL (ref 150–450)
RBC: 4.41 x10E6/uL (ref 4.14–5.80)
RDW: 13 % (ref 11.6–15.4)
WBC: 5.7 10*3/uL (ref 3.4–10.8)

## 2022-12-23 LAB — LIPID PANEL
Chol/HDL Ratio: 2.6 {ratio} (ref 0.0–5.0)
Cholesterol, Total: 77 mg/dL — ABNORMAL LOW (ref 100–199)
HDL: 30 mg/dL — ABNORMAL LOW (ref >39)
LDL Chol Calc (NIH): 24 mg/dL (ref 0–99)
Triglycerides: 126 mg/dL (ref 0–149)
VLDL Cholesterol Cal: 23 mg/dL (ref 5–40)

## 2022-12-23 LAB — HEMOGLOBIN A1C
Est. average glucose Bld gHb Est-mCnc: 169 mg/dL
Hgb A1c MFr Bld: 7.5 % — ABNORMAL HIGH (ref 4.8–5.6)

## 2023-01-06 ENCOUNTER — Telehealth: Payer: Self-pay | Admitting: "Endocrinology

## 2023-01-06 NOTE — Telephone Encounter (Signed)
error 

## 2023-01-26 ENCOUNTER — Other Ambulatory Visit: Payer: Self-pay | Admitting: Family Medicine

## 2023-02-09 ENCOUNTER — Other Ambulatory Visit: Payer: Self-pay | Admitting: "Endocrinology

## 2023-03-01 ENCOUNTER — Encounter: Payer: Self-pay | Admitting: Family Medicine

## 2023-03-01 NOTE — Telephone Encounter (Signed)
Plan of treatment will include: glimepiride 1 mg daily and metformin 1000 mg TWICE daily.

## 2023-03-01 NOTE — Telephone Encounter (Signed)
Pt. Will start taking jardiance and metformin at breakfast and then metformin at bedtime. For the time being . He was instructed to call or schedule a office visit for further evaluation if any new symptoms or contradictions arise.

## 2023-03-01 NOTE — Telephone Encounter (Signed)
No discontinue Glimepiride

## 2023-03-03 ENCOUNTER — Other Ambulatory Visit: Payer: Self-pay | Admitting: Cardiology

## 2023-03-31 LAB — TSH: TSH: 4.08 u[IU]/mL (ref 0.450–4.500)

## 2023-03-31 LAB — T4, FREE: Free T4: 1.09 ng/dL (ref 0.82–1.77)

## 2023-04-08 ENCOUNTER — Encounter: Payer: Self-pay | Admitting: "Endocrinology

## 2023-04-08 ENCOUNTER — Ambulatory Visit (INDEPENDENT_AMBULATORY_CARE_PROVIDER_SITE_OTHER): Payer: Medicare Other | Admitting: "Endocrinology

## 2023-04-08 VITALS — BP 120/52 | HR 68 | Ht 70.0 in | Wt 246.2 lb

## 2023-04-08 DIAGNOSIS — E782 Mixed hyperlipidemia: Secondary | ICD-10-CM

## 2023-04-08 DIAGNOSIS — E063 Autoimmune thyroiditis: Secondary | ICD-10-CM | POA: Diagnosis not present

## 2023-04-08 MED ORDER — LEVOTHYROXINE SODIUM 75 MCG PO TABS: 75.0000 ug | ORAL_TABLET | Freq: Every day | ORAL | 1 refills | Status: DC

## 2023-04-08 NOTE — Progress Notes (Signed)
Endocrinology follow-up note                                             04/08/2023, 9:16 AM   Subjective:    Patient ID: Bradley Acevedo, male    DOB: 10-05-1945, PCP Del Nigel Berthold, FNP   Past Medical History:  Diagnosis Date   Acid reflux    CAD (coronary artery disease)    Moderate LAD disease May 2022 managed medically   Diverticulosis    Hypertension    Hypothyroid    IBS (irritable bowel syndrome)    Type 2 diabetes mellitus [facility])    Past Surgical History:  Procedure Laterality Date   CIRCUMCISION     COLONOSCOPY     COLONOSCOPY  06/25/2016   [place], [place] by [name]   CORONARY PRESSURE/FFR STUDY N/A 07/25/2020   Procedure: INTRAVASCULAR PRESSURE WIRE/FFR STUDY;  Surgeon: [name], MD;  Location: [facility];  Service: Cardiovascular;  Laterality: N/A;   LEFT HEART CATH AND CORONARY ANGIOGRAPHY N/A 07/25/2020   Procedure: LEFT HEART CATH AND CORONARY ANGIOGRAPHY;  Surgeon: [name], MD;  Location: [facility];  Service: Cardiovascular;  Laterality: N/A;   meniscus left knee     MENISCUS REPAIR Left 05/28/2017   [place], [place] by Dr. [name]   TONSILLECTOMY  1953   [place], [place]   Social History   Socioeconomic History   Marital status: Married    Spouse name: Not on file   Number of children: Not on file   Years of education: Not on file   Highest education level: Associate degree: occupational, Scientist, product/process development, or vocational program  Occupational History   Not on file  Tobacco Use   Smoking status: Never   Smokeless tobacco: Never  Vaping Use   Vaping status: Never Used  Substance and Sexual Activity   Alcohol use: Yes    Comment: rarely   Drug use: Not Currently   Sexual activity: Not on file  Other Topics Concern   Not on file  Social History Narrative   Not on file   Social Drivers of Health   Financial Resource Strain: Low Risk  (12/21/2022)   Overall Financial Resource Strain (CARDIA)     Difficulty of Paying Living Expenses: Not hard at all  Food Insecurity: No Food Insecurity (12/21/2022)   Hunger Vital Sign    Worried About Running Out of Food in the Last Year: Never true    Ran Out of Food in the Last Year: Never true  Transportation Needs: No Transportation Needs (12/21/2022)   PRAPARE - Administrator, Civil Service (Medical): No    Lack of Transportation (Non-Medical): No  Physical Activity: Insufficiently Active (12/21/2022)   Exercise Vital Sign    Days of Exercise per Week: 2 days    Minutes of Exercise per Session: 30 min  Stress: No Stress Concern Present (12/21/2022)   [employer] of Occupational Health - Occupational Stress Questionnaire    Feeling of Stress : Only a little  Social Connections: Socially Integrated (12/21/2022)   Social Connection and Isolation Panel [NHANES]    Frequency of Communication with Friends and Family: More than three times a week    Frequency of Social Gatherings with Friends and Family: Three times a week    Attends Religious Services: More than 4  times per year    Active Member of Clubs or Organizations: Yes    Attends Banker Meetings: 1 to 4 times per year    Marital Status: Married   Family History  Problem Relation Age of Onset   Cancer Mother    Hypertension Mother    Hyperlipidemia Mother    Hyperlipidemia Father    Hypertension Father    Outpatient Encounter Medications as of 04/08/2023  Medication Sig   Empagliflozin-metFORMIN HCl 12.07-998 MG TABS Take 1 tablet by mouth daily.   amLODipine (NORVASC) 5 MG tablet TAKE 1/2 TABLET BY MOUTH ONCE DAILY   aspirin EC 81 MG EC tablet Take 1 tablet (81 mg total) by mouth daily. Swallow whole. (Patient taking differently: Take 81 mg by mouth in the morning. Swallow whole.)   Cholecalciferol (D3 DOTS) 50 MCG (2000 UT) TBDP Take 2,000 Units by mouth in the morning.   diphenhydrAMINE (BENADRYL) 25 MG tablet Take 25 mg by mouth daily.    isosorbide mononitrate (IMDUR) 60 MG 24 hr tablet Take 1 tablet (60 mg total) by mouth in the morning and at bedtime.   levothyroxine (SYNTHROID) 75 MCG tablet Take 1 tablet (75 mcg total) by mouth daily before breakfast.   lisinopril (ZESTRIL) 10 MG tablet Take 1 tablet (10 mg total) by mouth every morning.   metFORMIN (GLUCOPHAGE) 1000 MG tablet Take 1 tablet (1,000 mg total) by mouth daily.   metoprolol succinate (TOPROL-XL) 50 MG 24 hr tablet TAKE 1 TABLET BY MOUTH EVERY DAY WITH OR IMMEDIATELY FOLLOWING A MEAL   nitroGLYCERIN (NITROSTAT) 0.4 MG SL tablet Place 1 tablet (0.4 mg total) under the tongue every 5 (five) minutes x 3 doses as needed for chest pain.   Omega-3 Fatty Acids (FISH OIL) 1000 MG CAPS Take 1 capsule by mouth daily.   ondansetron (ZOFRAN-ODT) 4 MG disintegrating tablet Take 4 mg by mouth every 8 (eight) hours as needed for nausea or vomiting.   pantoprazole (PROTONIX) 40 MG tablet Take 40 mg by mouth daily.   Probiotic Product (PROBIOTIC PO) Take 1 capsule by mouth in the morning.   rosuvastatin (CRESTOR) 20 MG tablet Take 1 tablet (20 mg total) by mouth daily.   [DISCONTINUED] glimepiride (AMARYL) 1 MG tablet TAKE 1 TABLET DAILY WITH   BREAKFAST   [DISCONTINUED] levothyroxine (SYNTHROID) 50 MCG tablet Take 50 mcg by mouth daily before breakfast.   [DISCONTINUED] levothyroxine (SYNTHROID) 50 MCG tablet TAKE 1 TABLET DAILY   Facility-Administered Encounter Medications as of 04/08/2023  Medication   sodium chloride flush (NS) 0.9 % injection 3 mL   ALLERGIES: No Known Allergies  VACCINATION STATUS: Immunization History  Administered Date(s) Administered   Fluad Trivalent(High Dose 65+) 12/22/2022   Influenza Split 01/07/2017   Influenza-Unspecified 12/26/2019, 12/02/2020   Moderna Covid-19 Vaccine Bivalent Booster 51yrs & up 12/30/2020   Moderna Sars-Covid-2 Vaccination 04/20/2019, 05/18/2019, 01/11/2020, 05/16/2020   Pneumococcal Conjugate-13 07/13/2016    Pneumococcal Polysaccharide-23 12/22/2018   Tdap 02/07/2011, 02/17/2021   Zoster Recombinant(Shingrix) 04/20/2019, 05/18/2019, 07/03/2019, 09/04/2019    HPI her Bradley Acevedo is 78 y.o. male who presents today with a medical history as above. he is being seen in consultation for hypothyroidism requested by Del Nigel Berthold, FNP.   History is obtained directly from the patient and chart review.  He reports that he was diagnosed with hypothyroidism at approximate age of 73.  He was given levothyroxine as thyroid hormone replacement.  He is currently on levothyroxine 50 mcg p.o.  daily before breakfast.  He has no new complaints today.   He presents with minimally fluctuating body weight.     He denies palpitations, tremors, nor heat intolerance.  He denies dysphagia, shortness of breath nor voice change. He denies any family history of thyroid dysfunction or thyroid malignancy. His other medical problems include type 2 diabetes on metformin, hyperlipidemia on Crestor and hypertension on amlodipine, metoprolol, lisinopril.   Review of Systems  Constitutional: no recent weight gain/loss, no fatigue, no subjective hyperthermia, no subjective hypothermia    Objective:       04/08/2023    8:47 AM 12/22/2022    9:13 AM 10/05/2022    2:45 PM  Vitals with BMI  Height 5\' 10"  5\' 10"  5\' 10"   Weight 246 lbs 3 oz 249 lbs 1 oz 246 lbs  BMI 35.33 35.74 35.3  Systolic 120 136 161  Diastolic 52 72 56  Pulse 68 78 84    BP (!) 120/52   Pulse 68   Ht 5\' 10"  (1.778 m)   Wt 246 lb 3.2 oz (111.7 kg)   BMI 35.33 kg/m   Wt Readings from Last 3 Encounters:  04/08/23 246 lb 3.2 oz (111.7 kg)  12/22/22 249 lb 1.3 oz (113 kg)  10/05/22 246 lb (111.6 kg)    Physical Exam  Constitutional:  Body mass index is 35.33 kg/m.,  not in acute distress, normal state of mind Eyes: PERRLA, EOMI, no exophthalmos ENT: moist mucous membranes, no gross thyromegaly, no gross cervical  lymphadenopathy   CMP ( most recent) CMP     Component Value Date/Time   NA 138 12/22/2022 1035   K 5.0 12/22/2022 1035   CL 103 12/22/2022 1035   CO2 22 12/22/2022 1035   GLUCOSE 149 (H) 12/22/2022 1035   GLUCOSE 204 (H) 07/12/2022 1025   BUN 18 12/22/2022 1035   CREATININE 1.07 12/22/2022 1035   CALCIUM 9.7 12/22/2022 1035   PROT 6.4 06/19/2022 1141   ALBUMIN 4.4 06/19/2022 1141   AST 24 06/19/2022 1141   ALT 29 06/19/2022 1141   ALKPHOS 63 06/19/2022 1141   BILITOT 0.3 06/19/2022 1141   GFRNONAA >60 07/12/2022 1025     Diabetic Labs (most recent): Lab Results  Component Value Date   HGBA1C 7.5 (H) 12/22/2022   HGBA1C 7.1 (H) 09/21/2022   HGBA1C 7.6 (H) 06/19/2022     Lipid Panel ( most recent) Lipid Panel     Component Value Date/Time   CHOL 77 (L) 12/22/2022 1035   TRIG 126 12/22/2022 1035   HDL 30 (L) 12/22/2022 1035   CHOLHDL 2.6 12/22/2022 1035   CHOLHDL 3.4 06/28/2020 1035   VLDL 20 06/28/2020 1035   LDLCALC 24 12/22/2022 1035   LABVLDL 23 12/22/2022 1035      Lab Results  Component Value Date   TSH 4.080 03/30/2023   TSH 1.860 09/21/2022   TSH 5.440 (H) 03/19/2022   FREET4 1.09 03/30/2023   FREET4 1.28 09/21/2022   FREET4 1.13 03/19/2022         Assessment & Plan:   1. Hypothyroidism 2.  Hashimoto's thyroiditis 3.  Hyperlipidemia  - I have reviewed his new and available thyroid records and clinically evaluated the patient. - Based on these reviews, he has hypothyroidism due to Hashimoto's thyroiditis with evidence of appropriate replacement. -His previsit labs are states that he may benefit from a higher dose of levothyroxine.  I discussed and increase his levothyroxine to 75 mcg p.o. daily before breakfast.     -  We discussed about the correct intake of his thyroid hormone, on empty stomach at fasting, with water, separated by at least 30 minutes from breakfast and other medications,  and separated by more than 4 hours from calcium,  iron, multivitamins, acid reflux medications (PPIs). -Patient is made aware of the fact that thyroid hormone replacement is needed for life, dose to be adjusted by periodic monitoring of thyroid function tests.   In the absence of clinical goiter, he will not need thyroid imaging at this time. He is advised to maintain close follow-up with his PMD for management of diabetes, hypertension and hyperlipidemia.  He is advised to continue Crestor 20 mg p.o. nightly.  Side effects and precautions discussed with him.    - he is advised to maintain close follow up with Del Nigel Berthold, FNP for primary care needs.   I spent  22  minutes in the care of the patient today including review of labs from Thyroid Function, CMP, and other relevant labs ; imaging/biopsy records (current and previous including abstractions from other facilities); face-to-face time discussing  his lab results and symptoms, medications doses, his options of short and long term treatment based on the latest standards of care / guidelines;   and documenting the encounter.  Bradley Acevedo  participated in the discussions, expressed understanding, and voiced agreement with the above plans.  All questions were answered to his satisfaction. he is encouraged to contact clinic should he have any questions or concerns prior to his return visit.   Follow up plan: Return in about 6 months (around 10/06/2023) for F/U with Pre-visit Labs.   Marquis Lunch, MD [facility] [address] [place], [place] [address] Phone: [phone]  Fax: [phone]     04/08/2023, 9:16 AM  This note was partially dictated with voice recognition software. Similar sounding words can be transcribed inadequately or may not  be corrected upon review.

## 2023-04-12 IMAGING — CT CT RENAL STONE PROTOCOL
2 of 4 series · 16 of 46 positions shown, 18 images · non-contrast
Comparison: 01/22/2020

CLINICAL DATA: LEFT flank pain without hematuria for 2-3 days,
nephrolithiasis

EXAM:
CT ABDOMEN AND PELVIS WITHOUT CONTRAST
TECHNIQUE: Multidetector CT imaging of the abdomen and pelvis was performed
following the standard protocol without IV contrast.

[Series 2: axial st · axial · 0.98mm/px · z∈[+899,+1319]mm · 13 of 98 slices shown, 15 images]
[im 7/98  soft-tissue]
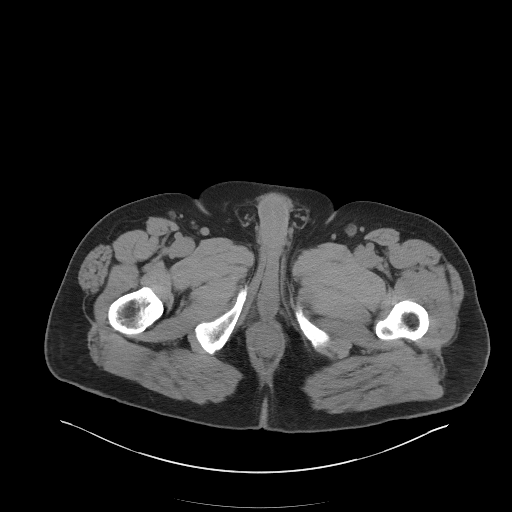
[im 7/98  bone]
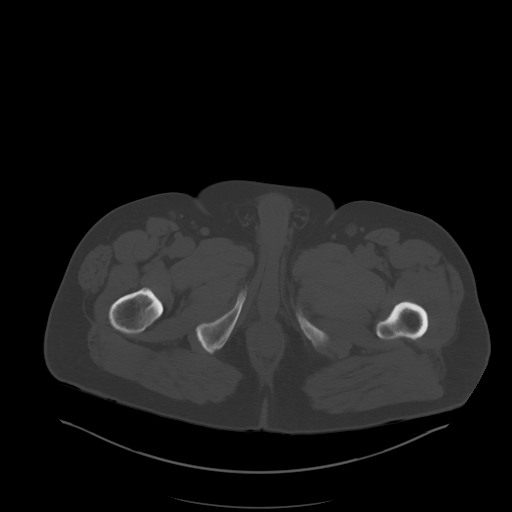
[im 13/98  soft-tissue]
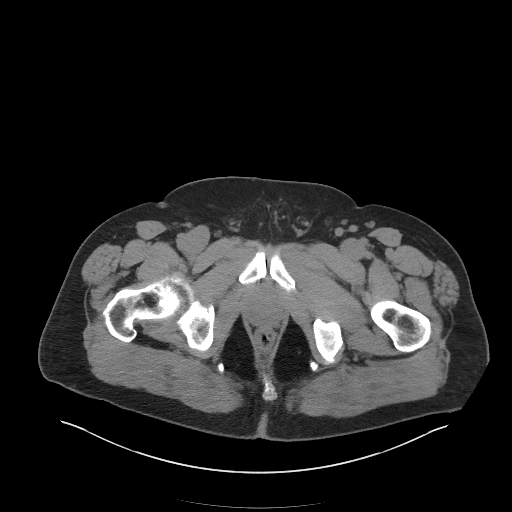
[im 19/98  soft-tissue]
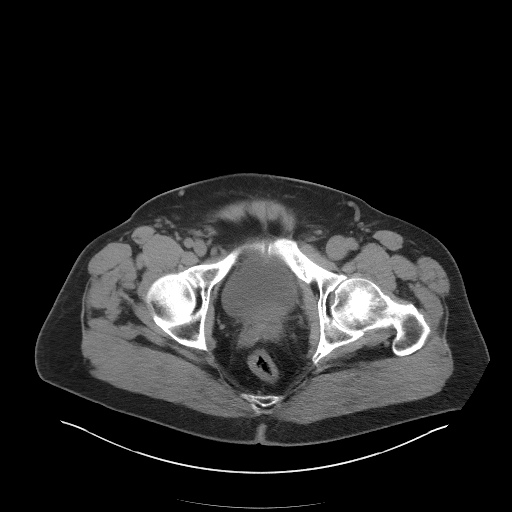
[im 31/98  soft-tissue]
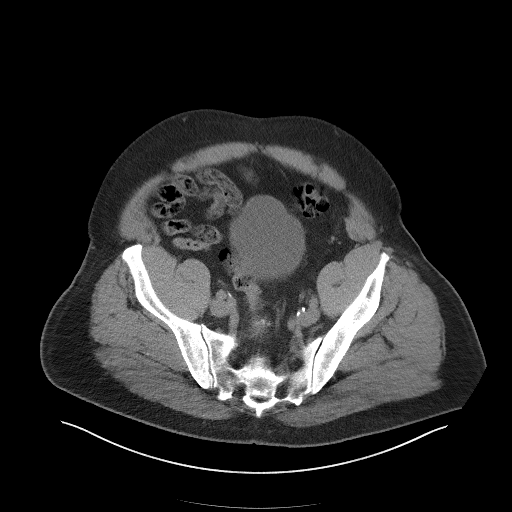
[im 37/98  soft-tissue]
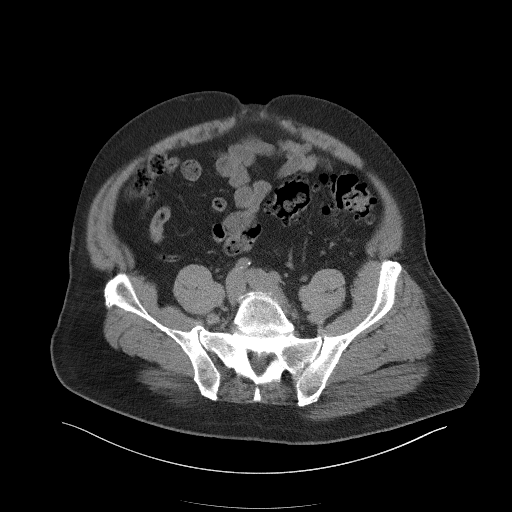
[im 43/98  soft-tissue]
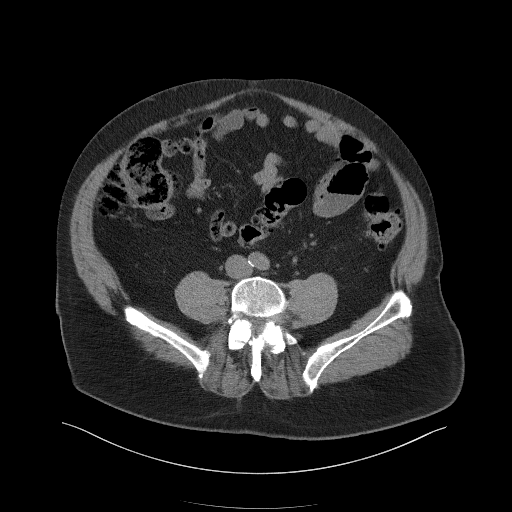
[im 49/98  soft-tissue]
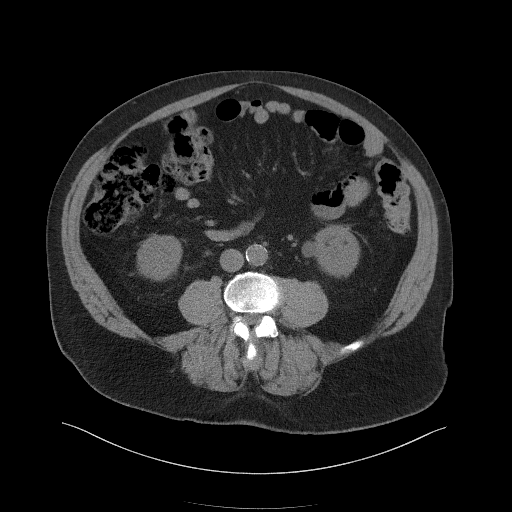
[im 55/98  soft-tissue]
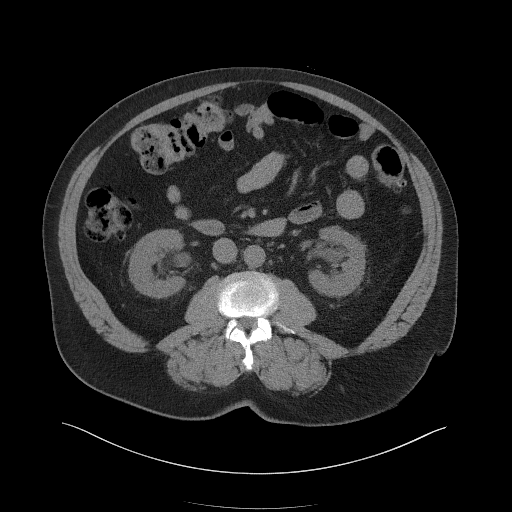
[im 61/98  soft-tissue]
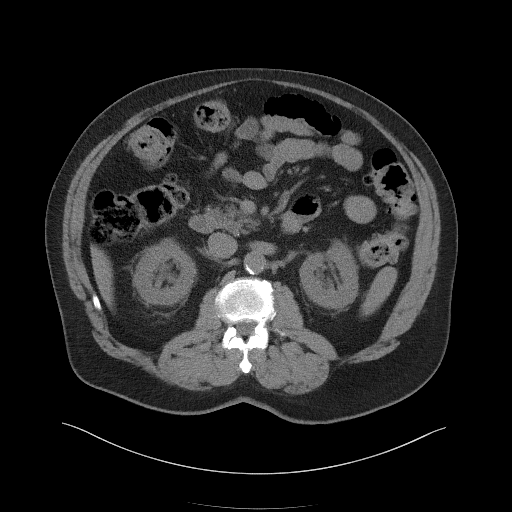
[im 61/98  bone]
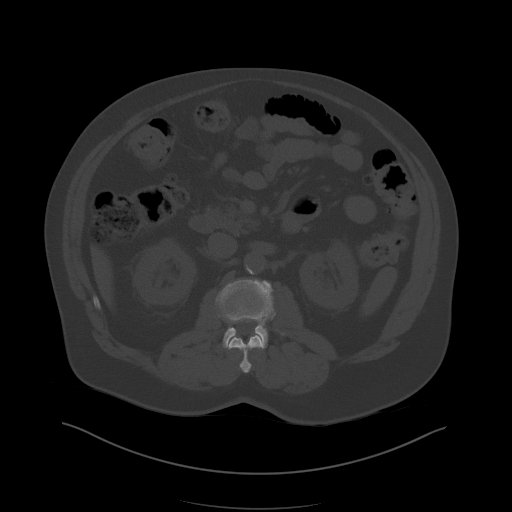
[im 67/98  soft-tissue]
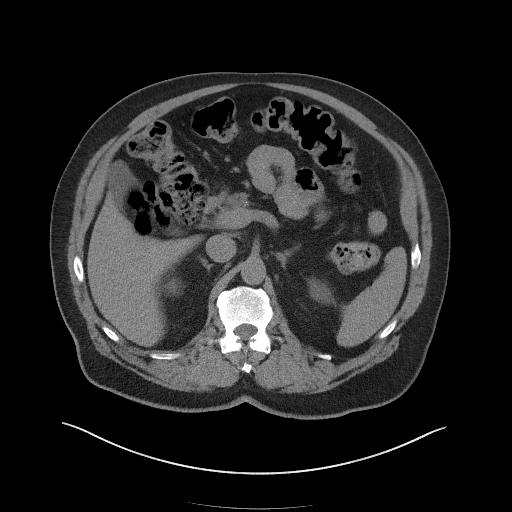
[im 79/98  soft-tissue]
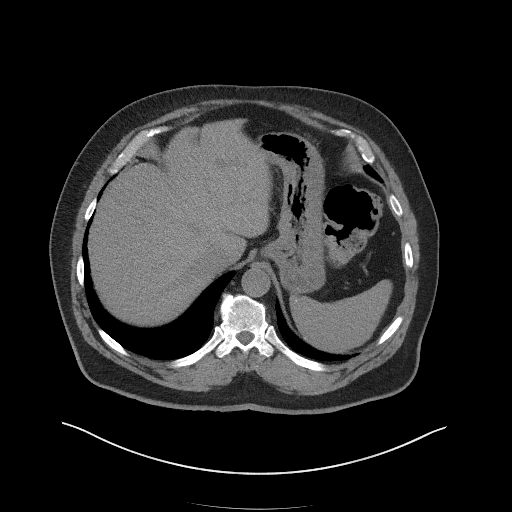
[im 85/98  soft-tissue]
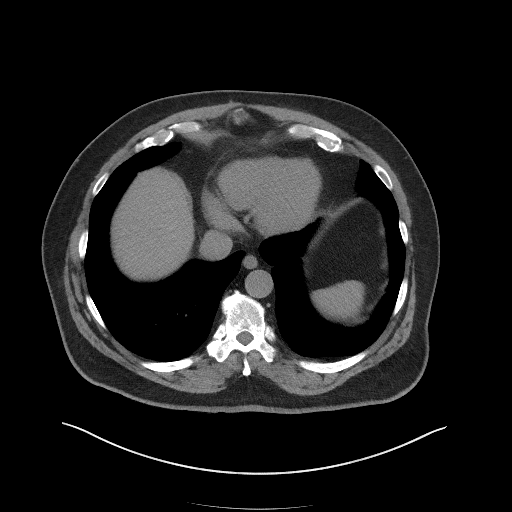
[im 91/98  soft-tissue]
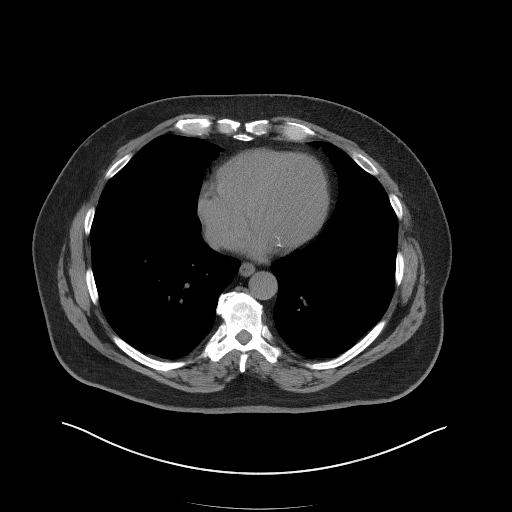

[Series 5: coronal st · coronal · 0.88mm/px · 3 of 115 slices shown]
[im 39/115  soft-tissue]
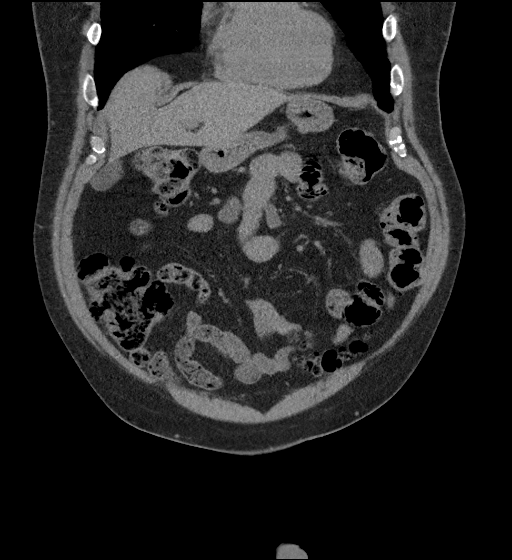
[im 51/115  soft-tissue]
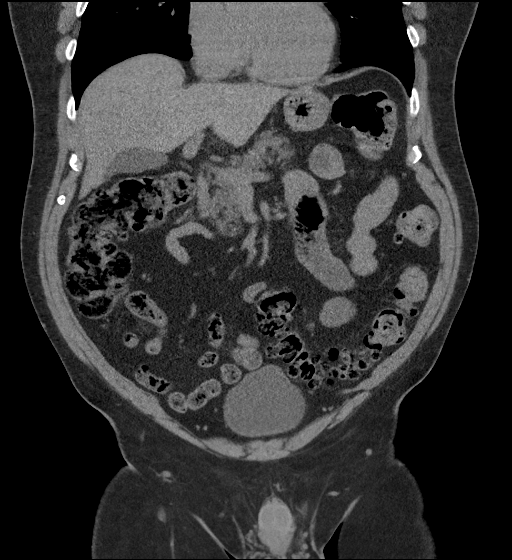
[im 64/115  soft-tissue]
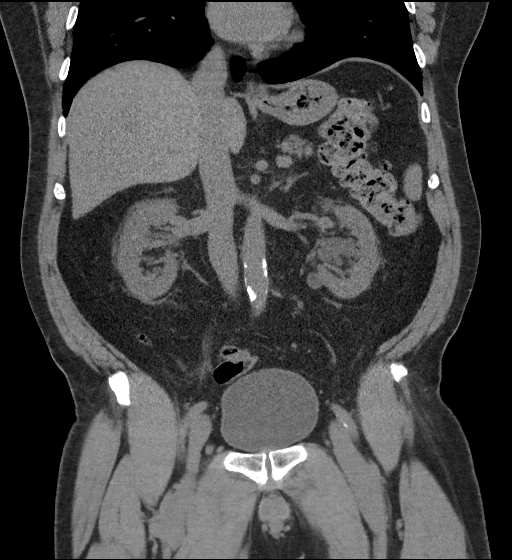

[16 of 46 positions shown; findings below may reference images not displayed]

FINDINGS: Lower chest: Lung bases clear

Hepatobiliary: Tiny dependent density at lower gallbladder segment
likely tiny calculus. Gallbladder and liver otherwise normal
appearance

Pancreas: Few parenchymal calcifications question chronic calcific
pancreatitis. No focal mass.

Spleen: Few tiny calcified granulomata. Subtle low-attenuation
lesion inferior spleen 11 mm diameter, nonspecific but unchanged.

Adrenals/Urinary Tract: Adrenal glands normal appearance. Mild
perinephric stranding bilaterally. Small cyst medial aspect inferior
pole LEFT kidney 13 mm diameter. Peripelvic cysts bilaterally. No
urinary tract calcification, hydronephrosis, or hydroureter. Bladder
unremarkable.

Stomach/Bowel: Normal appendix. Distal colonic diverticulosis
without evidence of diverticulitis. Bowel loops otherwise
unremarkable. Stomach normal appearance.

Vascular/Lymphatic: Atherosclerotic calcifications aorta and iliac
arteries without aneurysm. No adenopathy.

Reproductive: Minimal prostatic prominence. Seminal vesicles
unremarkable.

Other: No free air or free fluid. Tiny umbilical hernia containing
fat. No inflammatory process identified.

Musculoskeletal: Degenerative facet disease changes lower lumbar
spine. No acute osseous findings.
IMPRESSION: Distal colonic diverticulosis without evidence of diverticulitis.

BILATERAL peripelvic and small LEFT renal cysts.

No urinary tract calcification or dilatation.

Probable tiny gallstone.

Tiny umbilical hernia containing fat.

Aortic Atherosclerosis (O6QWB-OUN.N).

## 2023-04-13 IMAGING — CT CT RENAL STONE PROTOCOL
2 of 4 series · 16 of 46 positions shown, 18 images · non-contrast
Comparison: 02/26/2021

CLINICAL DATA: Left flank pain.

EXAM:
CT ABDOMEN AND PELVIS WITHOUT CONTRAST
TECHNIQUE: Multidetector CT imaging of the abdomen and pelvis was performed
following the standard protocol without IV contrast.

[Series 2: axial st · axial · 0.79mm/px · z∈[+1103,+1543]mm · 13 of 100 slices shown, 15 images]
[im 6/100  soft-tissue]
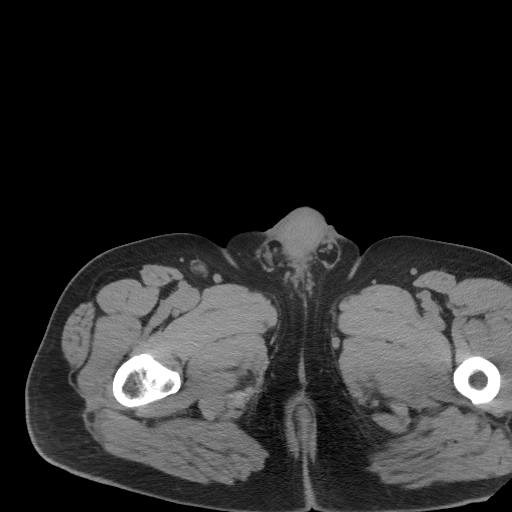
[im 6/100  bone]
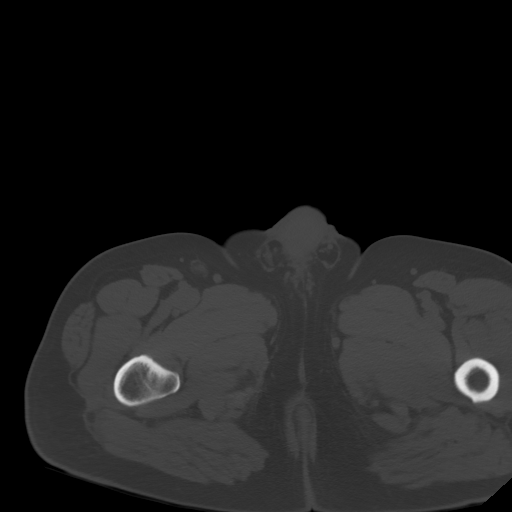
[im 12/100  soft-tissue]
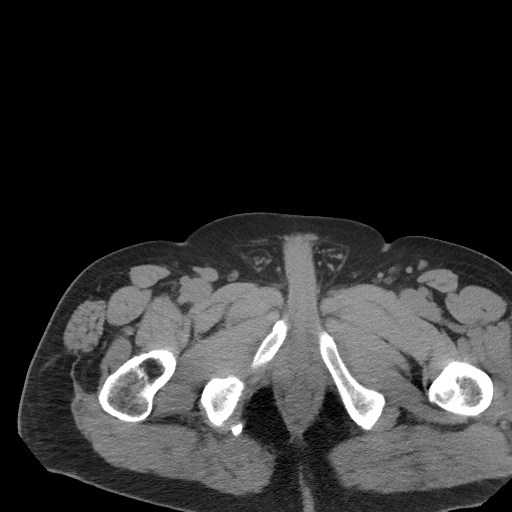
[im 24/100  soft-tissue]
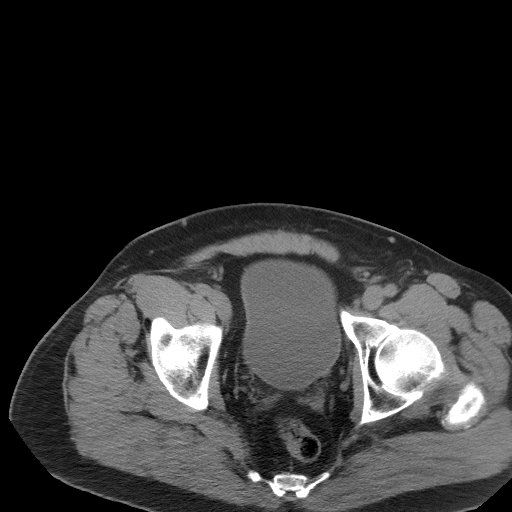
[im 30/100  soft-tissue]
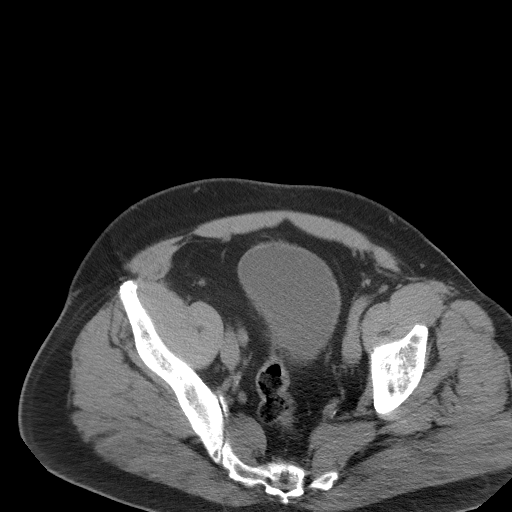
[im 35/100  soft-tissue]
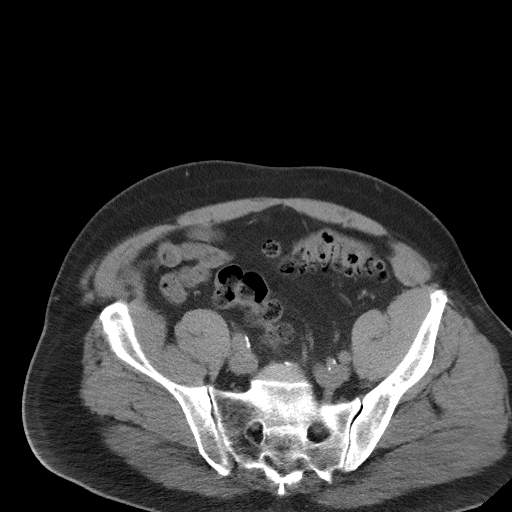
[im 41/100  soft-tissue]
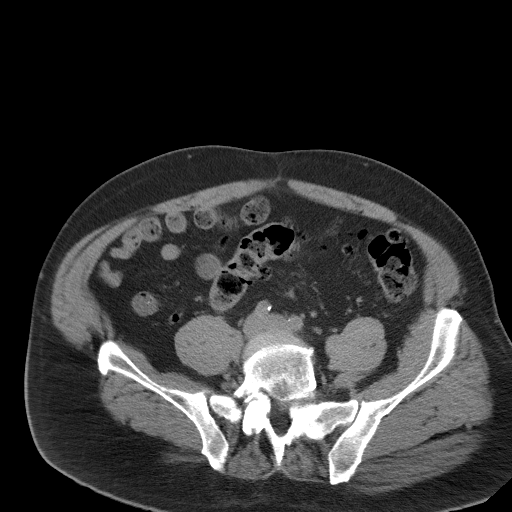
[im 53/100  soft-tissue]
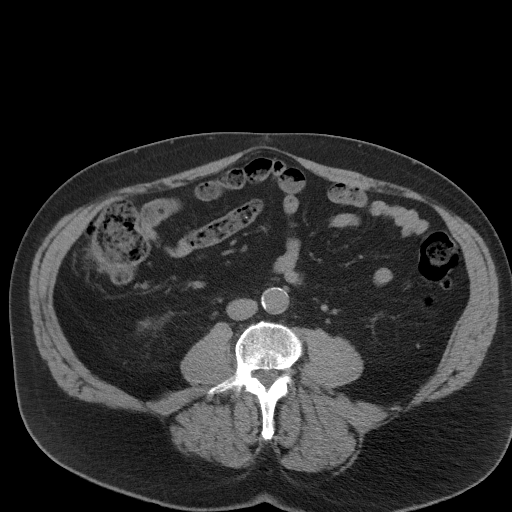
[im 59/100  soft-tissue]
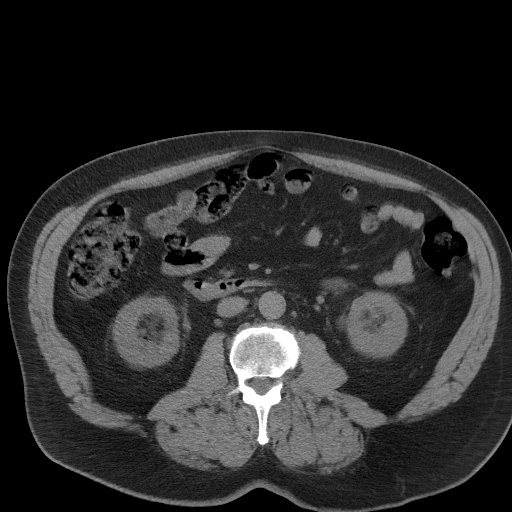
[im 65/100  soft-tissue]
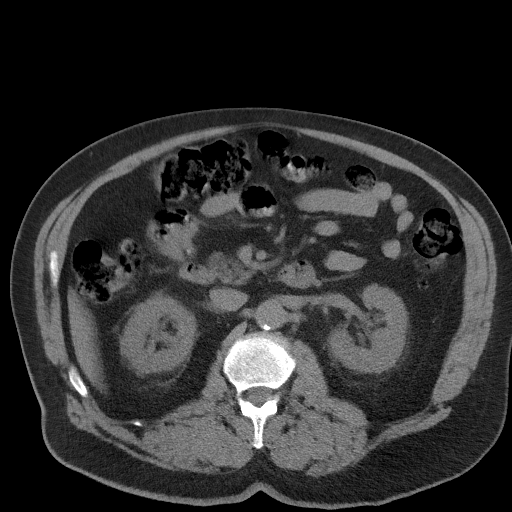
[im 65/100  bone]
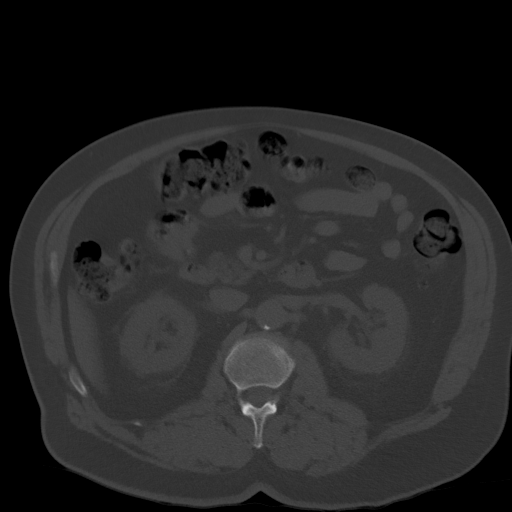
[im 70/100  soft-tissue]
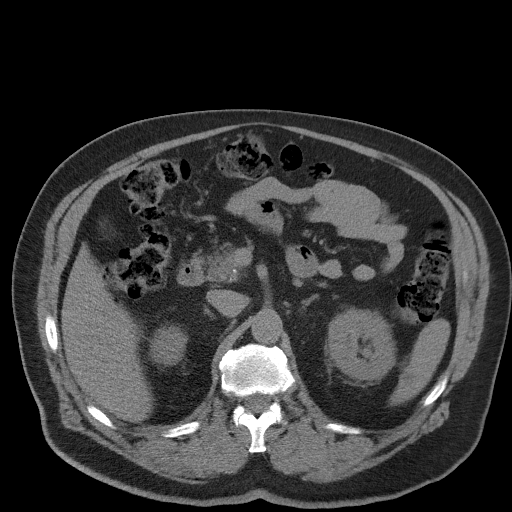
[im 76/100  soft-tissue]
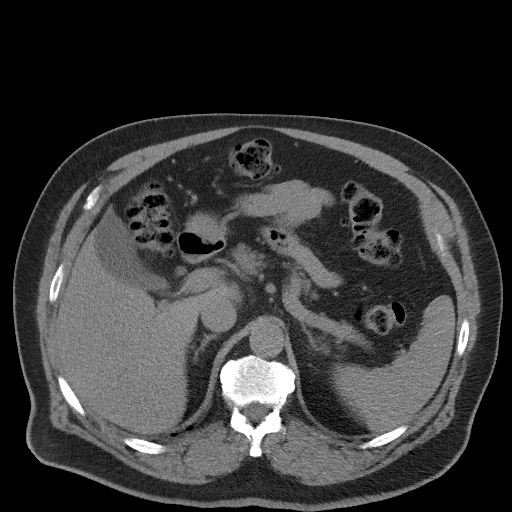
[im 88/100  soft-tissue]
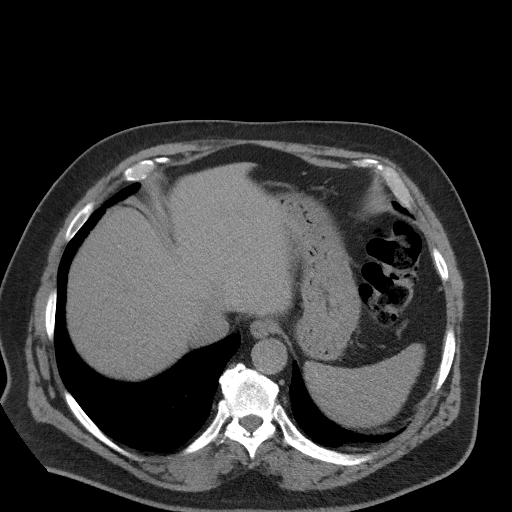
[im 94/100  soft-tissue]
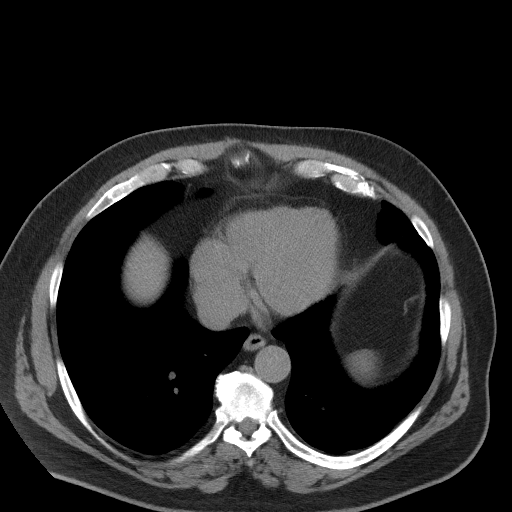

[Series 5: coronal st · coronal · 0.84mm/px · 3 of 101 slices shown]
[im 34/101  soft-tissue]
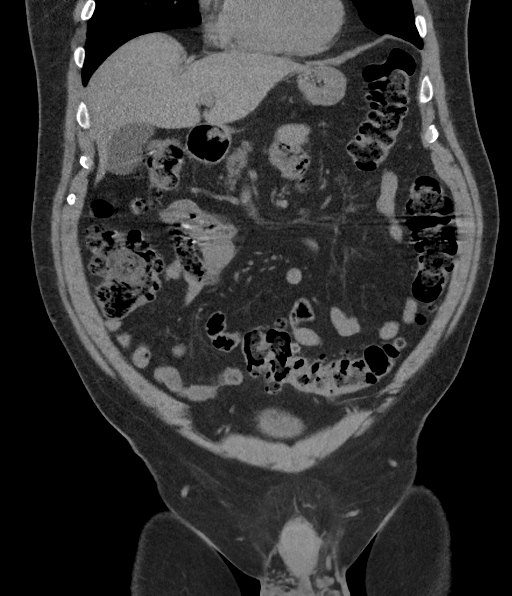
[im 45/101  soft-tissue]
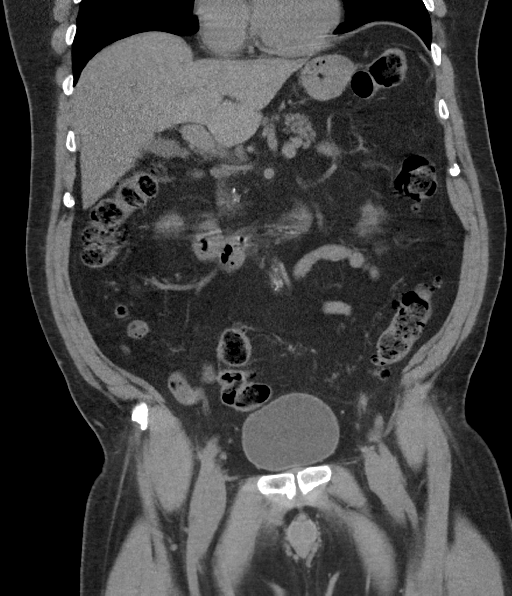
[im 56/101  soft-tissue]
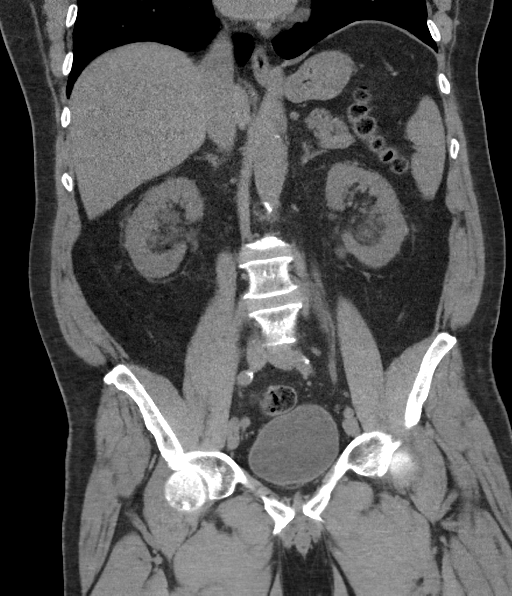

[16 of 46 positions shown; findings below may reference images not displayed]

FINDINGS: Lower chest: Unremarkable.

Hepatobiliary: No focal abnormality in the liver on this study
without intravenous contrast. Tiny gallstone noted in the neck of
the gallbladder. No intrahepatic or extrahepatic biliary dilation.

Pancreas: No focal mass lesion. No dilatation of the main duct. No
intraparenchymal cyst. No peripancreatic edema.

Spleen: No splenomegaly. No focal mass lesion.

Adrenals/Urinary Tract: No adrenal nodule or mass. Kidneys
unremarkable. 15 mm exophytic cyst noted lower pole left kidney. No
evidence for hydroureter. The urinary bladder appears normal for the
degree of distention.

Stomach/Bowel: Stomach is unremarkable. No gastric wall thickening.
No evidence of outlet obstruction. Duodenum is normally positioned
as is the ligament of Treitz. No small bowel wall thickening. No
small bowel dilatation. The terminal ileum is normal. The appendix
is normal. No gross colonic mass. No colonic wall thickening.
Diverticuli are seen scattered along the entire length of the colon
without CT findings of diverticulitis.

Vascular/Lymphatic: There is mild atherosclerotic calcification of
the abdominal aorta without aneurysm. There is no gastrohepatic or
hepatoduodenal ligament lymphadenopathy. No retroperitoneal or
mesenteric lymphadenopathy. No pelvic sidewall lymphadenopathy.

Reproductive: The prostate gland and seminal vesicles are
unremarkable.

Other: No intraperitoneal free fluid.

Musculoskeletal: No worrisome lytic or sclerotic osseous
abnormality.
IMPRESSION: 1. No acute findings in the abdomen or pelvis. Specifically, no
findings to explain the patient's history of left flank pain.
2. Diffuse colonic diverticulosis without diverticulitis.
3. Cholelithiasis.
4. Aortic Atherosclerosis ([id number]-[id number].O).

## 2023-04-24 ENCOUNTER — Other Ambulatory Visit: Payer: Self-pay | Admitting: Family Medicine

## 2023-05-26 ENCOUNTER — Ambulatory Visit: Payer: Medicare Other | Admitting: Family Medicine

## 2023-05-26 ENCOUNTER — Encounter: Payer: Self-pay | Admitting: Family Medicine

## 2023-05-26 VITALS — BP 133/76 | HR 64 | Ht 70.0 in | Wt 240.0 lb

## 2023-05-26 DIAGNOSIS — I10 Essential (primary) hypertension: Secondary | ICD-10-CM | POA: Diagnosis not present

## 2023-05-26 DIAGNOSIS — M25562 Pain in left knee: Secondary | ICD-10-CM | POA: Diagnosis not present

## 2023-05-26 DIAGNOSIS — G4700 Insomnia, unspecified: Secondary | ICD-10-CM | POA: Insufficient documentation

## 2023-05-26 DIAGNOSIS — E119 Type 2 diabetes mellitus without complications: Secondary | ICD-10-CM

## 2023-05-26 DIAGNOSIS — R350 Frequency of micturition: Secondary | ICD-10-CM

## 2023-05-26 DIAGNOSIS — F5101 Primary insomnia: Secondary | ICD-10-CM

## 2023-05-26 DIAGNOSIS — E1169 Type 2 diabetes mellitus with other specified complication: Secondary | ICD-10-CM | POA: Diagnosis not present

## 2023-05-26 DIAGNOSIS — Z7984 Long term (current) use of oral hypoglycemic drugs: Secondary | ICD-10-CM

## 2023-05-26 MED ORDER — TRAZODONE HCL 50 MG PO TABS: 50.0000 mg | ORAL_TABLET | Freq: Every evening | ORAL | 3 refills | Status: DC | PRN

## 2023-05-26 NOTE — Assessment & Plan Note (Signed)
 Xray ordered Advise patient treatment plan for knee pain includes rest, applying ice for 15-20 minutes several times a day, and gentle stretching and strengthening exercises to improve muscle support. Possible Physical therapy referral may help with proper technique. Using a knee brace or compression bandage can provide support, and elevating the leg helps reduce swelling. Low-impact exercises like swimming or cycling should be introduced gradually, and maintaining a healthy weight can prevent future pain.

## 2023-05-26 NOTE — Progress Notes (Signed)
 Established Patient Office Visit   Subjective  Patient ID: Bradley Acevedo, male    DOB: March 11, 1945  Age: 78 y.o. MRN: [id number]  Chief Complaint  Patient presents with   Hypertension    5 month follow up.  Urinary Frequency Ease w. Falling asleep but trouble staying asleep  Left knee pain     He  has a past medical history of Acid reflux, CAD (coronary artery disease), Diverticulosis, Hypertension, Hypothyroid, IBS (irritable bowel syndrome), and Type 2 diabetes mellitus (HCC).  Patient reports urinating approximately 10-15 times during the day and waking up twice at night to urinate. The frequency of urination has increased over the past three months. The patient does experience difficulty in emptying the bladder  with urgency. The stream is weak, and there is occasional dribbling after urination. The patient also experiences some fullness and pressure in the lower abdomen and pelvic area. No pain, burning, or discomfort is noted during urination,  There are no recent changes in medication or a history of prostate issues, but the patient does have a history of Type 2 diabetes and hypertension.  Patient has been experiencing stabbing pain in the left knee for the past three weeks, following a history of left knee surgery in 2018 and a twisting injury in 2021. There has been no recent injury. The pain, rated 8/10 in severity, fluctuates and is aggravated by movement, palpation, and weight-bearing activities. The patient reports weakness and an inability to bear weight on the affected leg, with moderate relief from NSAIDs, acetaminophen, rest, and wearing a brace. There are no symptoms of numbness, tingling, or foreign body sensation.  The patient also reports ongoing insomnia, with difficulty falling asleep that has been gradually worsening over the past year. The patient typically falls asleep within 15-30 minutes of going to bed at 8-10 PM. Sleep disturbances occur nightly, and symptoms are  aggravated by caffeine intake of six or more cups of coffee per day. While the patient finds some relief from a darkened, quiet environment, treatments such as Tylenol PM and melatonin have not been effective. The patient has a history of hypertension.    Review of Systems  Constitutional:  Negative for chills and fever.  Eyes:  Negative for blurred vision.  Respiratory:  Negative for shortness of breath.   Cardiovascular:  Negative for chest pain.  Gastrointestinal:  Negative for abdominal pain.  Genitourinary:  Positive for frequency and urgency. Negative for dysuria, flank pain and hematuria.  Musculoskeletal:  Positive for joint pain and myalgias.       Left knee pain  Neurological:  Negative for dizziness and headaches.  Psychiatric/Behavioral:  The patient has insomnia.       Objective:     BP 133/76   Pulse 64   Ht 5\' 10"  (1.778 m)   Wt 240 lb 0.6 oz (108.9 kg)   SpO2 95%   BMI 34.44 kg/m  BP Readings from Last 3 Encounters:  05/26/23 133/76  04/08/23 (!) 120/52  12/22/22 136/72      Physical Exam Vitals reviewed.  Constitutional:      General: He is not in acute distress.    Appearance: Normal appearance. He is not ill-appearing, toxic-appearing or diaphoretic.  HENT:     Head: Normocephalic.  Eyes:     General:        Right eye: No discharge.        Left eye: No discharge.     Conjunctiva/sclera: Conjunctivae normal.  Cardiovascular:  Rate and Rhythm: Normal rate.     Pulses: Normal pulses.     Heart sounds: Normal heart sounds.  Pulmonary:     Effort: Pulmonary effort is normal. No respiratory distress.     Breath sounds: Normal breath sounds.  Abdominal:     General: Bowel sounds are normal.     Palpations: Abdomen is soft.     Tenderness: There is no abdominal tenderness. There is no right CVA tenderness, left CVA tenderness or guarding.  Musculoskeletal:        General: No tenderness. Normal range of motion.     Right knee: Normal range of  motion. No tenderness.     Left knee: Normal range of motion. No tenderness.     Right lower leg: No edema.     Left lower leg: No edema.  Skin:    General: Skin is warm and dry.     Capillary Refill: Capillary refill takes less than 2 seconds.  Neurological:     Mental Status: He is alert.     Coordination: Coordination normal.     Gait: Gait normal.  Psychiatric:        Mood and Affect: Mood normal.        Behavior: Behavior normal.      No results found for any visits on 05/26/23.  The ASCVD Risk score (Arnett DK, et al., 2019) failed to calculate for the following reasons:   The valid total cholesterol range is 130 to 320 mg/dL    Assessment & Plan:  Acute pain of left knee Assessment & Plan: Xray ordered Advise patient treatment plan for knee pain includes rest, applying ice for 15-20 minutes several times a day, and gentle stretching and strengthening exercises to improve muscle support. Possible Physical therapy referral may help with proper technique. Using a knee brace or compression bandage can provide support, and elevating the leg helps reduce swelling. Low-impact exercises like swimming or cycling should be introduced gradually, and maintaining a healthy weight can prevent future pain.   Orders: -     DG Knee Complete 4 Views Left; Future  Type 2 diabetes mellitus without complication, without long-term current use of insulin (HCC) Assessment & Plan: Last Hemoglobin A1c: 7.5 Labs: Ordered today, results pending; will follow up accordingly. The patient reports adhering to prescribed medications: Empagliflozin-metFORMIN HCl 12.07-998 mg once daily  Reviewed non-pharmacological interventions, including a balanced diet rich in lean proteins, healthy fats, whole grains, and high-fiber vegetables. Emphasized reducing refined sugars and processed carbohydrates, and incorporating more fruits, leafy greens, and legumes. Education: Patient was educated on recognizing signs  and symptoms of both hypoglycemia and hyperglycemia, and advised to seek emergency care if these symptoms occur. Follow-Up: Scheduled for follow-up in 3-4 months, or sooner if needed. Patient Understanding: The patient verbalized understanding of the care plan, and all questions were answered. Additional Care: Ophthalmology current Foot exam results were within normal limits.   Orders: -     Hemoglobin A1c  Primary hypertension -     Hemoglobin A1c -     Lipid panel -     BMP8+eGFR -     CBC with Differential/Platelet  Urinary frequency Assessment & Plan: Urinalysis,  and urine culture ordered- Advise avoid irritants like caffeine, alcohol, spicy foods, and artificial sweeteners, as they can aggravate your bladder. Wearing loose, breathable clothing, especially cotton underwear, can help keep the area dry and reduce bacterial growth. If symptoms persist or worsen follow up.   Orders: -  Urinalysis -     Ambulatory referral to Urology -     UA/M w/rflx Culture, Comp  Primary insomnia Assessment & Plan: Trial on trazodone 50 mg at bedtime Explained to go to bed at the same time each night and get up at the same time each morning, including on the weekends. Make sure your bedroom is quiet, dark, relaxing, and at a comfortable temperature. Remove electronic devices, such as TVs, computers, and smart phones, from the bedroom.    Essential hypertension Assessment & Plan: Vitals:   05/26/23 0953  BP: 133/76   Blood pressure reading controlled in today's visit. Labs ordered. Continue amlodipine 5 mg, Lisinopril 10 mg , Metoprolol 50 mg daily  Labs ordered Continued discussion on DASH diet, low sodium diet and maintain a exercise routine for 150 minutes per week.    Other orders -     traZODone HCl; Take 1 tablet (50 mg total) by mouth at bedtime as needed.  Dispense: 30 tablet; Refill: 3    Return in about 4 months (around 09/25/2023), or if symptoms worsen or fail to improve,  for chronic follow-up.   Cruzita Lederer Newman Nip, FNP

## 2023-05-26 NOTE — Assessment & Plan Note (Signed)
 Last Hemoglobin A1c: 7.5 Labs: Ordered today, results pending; will follow up accordingly. The patient reports adhering to prescribed medications: Empagliflozin-metFORMIN HCl 12.07-998 mg once daily  Reviewed non-pharmacological interventions, including a balanced diet rich in lean proteins, healthy fats, whole grains, and high-fiber vegetables. Emphasized reducing refined sugars and processed carbohydrates, and incorporating more fruits, leafy greens, and legumes. Education: Patient was educated on recognizing signs and symptoms of both hypoglycemia and hyperglycemia, and advised to seek emergency care if these symptoms occur. Follow-Up: Scheduled for follow-up in 3-4 months, or sooner if needed. Patient Understanding: The patient verbalized understanding of the care plan, and all questions were answered. Additional Care: Ophthalmology current Foot exam results were within normal limits.

## 2023-05-26 NOTE — Assessment & Plan Note (Signed)
 Trial on trazodone 50 mg at bedtime Explained to go to bed at the same time each night and get up at the same time each morning, including on the weekends. Make sure your bedroom is quiet, dark, relaxing, and at a comfortable temperature. Remove electronic devices, such as TVs, computers, and smart phones, from the bedroom.

## 2023-05-26 NOTE — Assessment & Plan Note (Signed)
 Urinalysis,  and urine culture ordered- Advise avoid irritants like caffeine, alcohol, spicy foods, and artificial sweeteners, as they can aggravate your bladder. Wearing loose, breathable clothing, especially cotton underwear, can help keep the area dry and reduce bacterial growth. If symptoms persist or worsen follow up.

## 2023-05-26 NOTE — Patient Instructions (Signed)

## 2023-05-26 NOTE — Assessment & Plan Note (Addendum)
 Vitals:   05/26/23 0953  BP: 133/76   Blood pressure reading controlled in today's visit. Labs ordered. Continue amlodipine 5 mg, Lisinopril 10 mg , Metoprolol 50 mg daily  Labs ordered Continued discussion on DASH diet, low sodium diet and maintain a exercise routine for 150 minutes per week.

## 2023-05-27 ENCOUNTER — Other Ambulatory Visit: Payer: Self-pay | Admitting: Family Medicine

## 2023-05-27 ENCOUNTER — Encounter: Payer: Self-pay | Admitting: Family Medicine

## 2023-05-27 ENCOUNTER — Ambulatory Visit (HOSPITAL_COMMUNITY)
Admission: RE | Admit: 2023-05-27 | Discharge: 2023-05-27 | Disposition: A | Discharge status: Home / Self Care | Source: Ambulatory Visit | Attending: Family Medicine | Admitting: Family Medicine

## 2023-05-27 DIAGNOSIS — M25562 Pain in left knee: Secondary | ICD-10-CM | POA: Diagnosis present

## 2023-05-27 LAB — CBC WITH DIFFERENTIAL/PLATELET
Basophils Absolute: 0.1 10*3/uL (ref 0.0–0.2)
Basos: 2 %
EOS (ABSOLUTE): 0.2 10*3/uL (ref 0.0–0.4)
Eos: 2 %
Hematocrit: 43.1 % (ref 37.5–51.0)
Hemoglobin: 14.1 g/dL (ref 13.0–17.7)
Immature Grans (Abs): 0 10*3/uL (ref 0.0–0.1)
Immature Granulocytes: 1 %
Lymphocytes Absolute: 1.6 10*3/uL (ref 0.7–3.1)
Lymphs: 25 %
MCH: 30 pg (ref 26.6–33.0)
MCHC: 32.7 g/dL (ref 31.5–35.7)
MCV: 92 fL (ref 79–97)
Monocytes Absolute: 0.4 10*3/uL (ref 0.1–0.9)
Monocytes: 7 %
Neutrophils Absolute: 4.1 10*3/uL (ref 1.4–7.0)
Neutrophils: 63 %
Platelets: 195 10*3/uL (ref 150–450)
RBC: 4.7 x10E6/uL (ref 4.14–5.80)
RDW: 13 % (ref 11.6–15.4)
WBC: 6.4 10*3/uL (ref 3.4–10.8)

## 2023-05-27 LAB — LIPID PANEL
Chol/HDL Ratio: 2.8 ratio (ref 0.0–5.0)
Cholesterol, Total: 88 mg/dL — ABNORMAL LOW (ref 100–199)
HDL: 32 mg/dL — ABNORMAL LOW (ref >39)
LDL Chol Calc (NIH): 30 mg/dL (ref 0–99)
Triglycerides: 151 mg/dL — ABNORMAL HIGH (ref 0–149)
VLDL Cholesterol Cal: 26 mg/dL (ref 5–40)

## 2023-05-27 LAB — BMP8+EGFR
BUN/Creatinine Ratio: 20 (ref 10–24)
BUN: 22 mg/dL (ref 8–27)
CO2: 21 mmol/L (ref 20–29)
Calcium: 10.1 mg/dL (ref 8.6–10.2)
Chloride: 102 mmol/L (ref 96–106)
Creatinine, Ser: 1.11 mg/dL (ref 0.76–1.27)
Glucose: 130 mg/dL — ABNORMAL HIGH (ref 70–99)
Potassium: 5.2 mmol/L (ref 3.5–5.2)
Sodium: 137 mmol/L (ref 134–144)
eGFR: 69 mL/min/{1.73_m2} (ref >59)

## 2023-05-27 LAB — UA/M W/RFLX CULTURE, COMP
Bilirubin, UA: NEGATIVE
Ketones, UA: NEGATIVE
Leukocytes,UA: NEGATIVE
Nitrite, UA: NEGATIVE
Protein,UA: NEGATIVE
RBC, UA: NEGATIVE
Specific Gravity, UA: 1.022 (ref 1.005–1.030)
Urobilinogen, Ur: 0.2 mg/dL (ref 0.2–1.0)
pH, UA: 5.5 (ref 5.0–7.5)

## 2023-05-27 LAB — HEMOGLOBIN A1C
Est. average glucose Bld gHb Est-mCnc: 171 mg/dL
Hgb A1c MFr Bld: 7.6 % — ABNORMAL HIGH (ref 4.8–5.6)

## 2023-05-27 LAB — MICROSCOPIC EXAMINATION
Bacteria, UA: NONE SEEN
Casts: NONE SEEN /LPF
Epithelial Cells (non renal): NONE SEEN /HPF (ref 0–10)
RBC, Urine: NONE SEEN /HPF (ref 0–2)
WBC, UA: NONE SEEN /HPF (ref 0–5)

## 2023-05-27 MED ORDER — GLIPIZIDE 5 MG PO TABS: 5.0000 mg | ORAL_TABLET | Freq: Two times a day (BID) | ORAL | 3 refills | Status: DC

## 2023-05-27 MED ORDER — ROSUVASTATIN CALCIUM 40 MG PO TABS: 40.0000 mg | ORAL_TABLET | Freq: Every day | ORAL | 3 refills | Status: AC

## 2023-06-04 ENCOUNTER — Other Ambulatory Visit: Payer: Self-pay | Admitting: Family Medicine

## 2023-06-10 ENCOUNTER — Encounter: Payer: Self-pay | Admitting: Family Medicine

## 2023-06-22 ENCOUNTER — Ambulatory Visit: Payer: Medicare Other

## 2023-07-01 ENCOUNTER — Other Ambulatory Visit: Payer: Self-pay | Admitting: Urology

## 2023-07-01 ENCOUNTER — Encounter: Payer: Self-pay | Admitting: Urology

## 2023-07-01 ENCOUNTER — Ambulatory Visit (INDEPENDENT_AMBULATORY_CARE_PROVIDER_SITE_OTHER): Admitting: Urology

## 2023-07-01 VITALS — BP 103/63 | HR 62

## 2023-07-01 DIAGNOSIS — F17200 Nicotine dependence, unspecified, uncomplicated: Secondary | ICD-10-CM

## 2023-07-01 DIAGNOSIS — R351 Nocturia: Secondary | ICD-10-CM

## 2023-07-01 DIAGNOSIS — R350 Frequency of micturition: Secondary | ICD-10-CM

## 2023-07-01 DIAGNOSIS — N3281 Overactive bladder: Secondary | ICD-10-CM

## 2023-07-01 DIAGNOSIS — R3915 Urgency of urination: Secondary | ICD-10-CM

## 2023-07-01 DIAGNOSIS — Z789 Other specified health status: Secondary | ICD-10-CM

## 2023-07-01 LAB — URINALYSIS, ROUTINE W REFLEX MICROSCOPIC
Bilirubin, UA: NEGATIVE
Ketones, UA: NEGATIVE
Leukocytes,UA: NEGATIVE
Nitrite, UA: NEGATIVE
Protein,UA: NEGATIVE
RBC, UA: NEGATIVE
Specific Gravity, UA: 1.02 (ref 1.005–1.030)
Urobilinogen, Ur: 0.2 mg/dL (ref 0.2–1.0)
pH, UA: 6 (ref 5.0–7.5)

## 2023-07-01 LAB — BLADDER SCAN AMB NON-IMAGING: Scan Result: 13

## 2023-07-01 MED ORDER — MIRABEGRON ER 25 MG PO TB24: 25.0000 mg | ORAL_TABLET | Freq: Every day | ORAL | 11 refills | Status: DC

## 2023-07-01 MED ORDER — GEMTESA 75 MG PO TABS: 1.0000 | ORAL_TABLET | Freq: Every day | ORAL | 11 refills | Status: DC

## 2023-07-01 NOTE — Progress Notes (Signed)
 Name: Bradley Acevedo DOB: 09-07-1945 MRN: [id number]  History of Present Illness: Bradley Acevedo is a 78 y.o. male who presents today as a new patient at [facility]. All available relevant medical records have been reviewed. He is accompanied by his wife Occupational psychologist.  Today: He reports urinary urgency, frequency, nocturia x1-2, intermittent urinary stream, and rare urge incontinence. Denies dysuria, gross hematuria, hesitancy, straining to void, or sensations of incomplete emptying. Reports drinking 3-5 caffeinated beverages daily.   No PSA results found per chart review. He denies history of elevated PSA.    Medications: Current Outpatient Medications  Medication Sig Dispense Refill   amLODipine  (NORVASC ) 5 MG tablet TAKE 1/2 TABLET BY MOUTH ONCE DAILY 45 tablet 3   aspirin  EC 81 MG EC tablet Take 1 tablet (81 mg total) by mouth daily. Swallow whole. (Patient taking differently: Take 81 mg by mouth in the morning. Swallow whole.) 30 tablet 11   Cholecalciferol (D3 DOTS) 50 MCG (2000 UT) TBDP Take 2,000 Units by mouth in the morning.     diphenhydrAMINE (BENADRYL) 25 MG tablet Take 25 mg by mouth daily.     Empagliflozin -metFORMIN  HCl 12.07-998 MG TABS Take 1 tablet by mouth daily.     glipiZIDE  (GLUCOTROL ) 5 MG tablet Take 1 tablet (5 mg total) by mouth 2 (two) times daily before a meal. 60 tablet 3   levothyroxine  (SYNTHROID ) 75 MCG tablet Take 1 tablet (75 mcg total) by mouth daily before breakfast. 90 tablet 1   lisinopril  (ZESTRIL ) 10 MG tablet TAKE 1 TABLET EVERY MORNING 90 tablet 3   metoprolol  succinate (TOPROL -XL) 50 MG 24 hr tablet TAKE 1 TABLET BY MOUTH EVERY DAY WITH OR IMMEDIATELY FOLLOWING A MEAL 90 tablet 3   mirabegron  ER (MYRBETRIQ ) 25 MG TB24 tablet Take 1 tablet (25 mg total) by mouth daily. 30 tablet 11   nitroGLYCERIN  (NITROSTAT ) 0.4 MG SL tablet Place 1 tablet (0.4 mg total) under the tongue every 5 (five) minutes x 3 doses as needed for chest  pain. 25 tablet 3   Omega-3 Fatty Acids (FISH OIL) 1000 MG CAPS Take 1 capsule by mouth daily.     ondansetron  (ZOFRAN -ODT) 4 MG disintegrating tablet Take 4 mg by mouth every 8 (eight) hours as needed for nausea or vomiting.     pantoprazole  (PROTONIX ) 40 MG tablet Take 40 mg by mouth daily.     Probiotic Product (PROBIOTIC PO) Take 1 capsule by mouth in the morning.     rosuvastatin  (CRESTOR ) 40 MG tablet Take 1 tablet (40 mg total) by mouth daily. 90 tablet 3   traZODone  (DESYREL ) 50 MG tablet Take 1 tablet (50 mg total) by mouth at bedtime as needed. 30 tablet 3   isosorbide  mononitrate (IMDUR ) 60 MG 24 hr tablet Take 1 tablet (60 mg total) by mouth in the morning and at bedtime. 180 tablet 3   No current facility-administered medications for this visit.    Allergies: No Known Allergies  Past Medical History:  Diagnosis Date   Acid reflux    CAD (coronary artery disease)    Moderate LAD disease May 2022 managed medically   Diverticulosis    Hypertension    Hypothyroid    IBS (irritable bowel syndrome)    Type 2 diabetes mellitus [facility])    Past Surgical History:  Procedure Laterality Date   CIRCUMCISION     COLONOSCOPY     COLONOSCOPY  06/25/2016   [place], [place] by Dr. Rosanna Comment  CORONARY PRESSURE/FFR STUDY N/A 07/25/2020   Procedure: INTRAVASCULAR PRESSURE WIRE/FFR STUDY;  Surgeon: Avanell Leigh, MD;  Location: [facility]VASIVE [facility];  Service: Cardiovascular;  Laterality: N/A;   LEFT HEART CATH AND CORONARY ANGIOGRAPHY N/A 07/25/2020   Procedure: LEFT HEART CATH AND CORONARY ANGIOGRAPHY;  Surgeon: Avanell Leigh, MD;  Location: [facility] INVASIVE CV [facility];  Service: Cardiovascular;  Laterality: N/A;   meniscus left knee     MENISCUS REPAIR Left 05/28/2017   [place], [place] by Dr. Brenita Callow   TONSILLECTOMY  1953   [place], [place]   Family History  Problem Relation Age of Onset   Cancer Mother    Hypertension Mother    Hyperlipidemia Mother    Hyperlipidemia Father    Hypertension  Father    Social History   Socioeconomic History   Marital status: Married    Spouse name: Not on file   Number of children: Not on file   Years of education: Not on file   Highest education level: Associate degree: occupational, Scientist, product/process development, or vocational program  Occupational History   Not on file  Tobacco Use   Smoking status: Never   Smokeless tobacco: Never  Vaping Use   Vaping status: Never Used  Substance and Sexual Activity   Alcohol use: Yes    Comment: rarely   Drug use: Not Currently   Sexual activity: Not on file  Other Topics Concern   Not on file  Social History Narrative   Not on file   Social Drivers of Health   Financial Resource Strain: Low Risk  (12/21/2022)   Overall Financial Resource Strain (CARDIA)    Difficulty of Paying Living Expenses: Not hard at all  Food Insecurity: No Food Insecurity (12/21/2022)   Hunger Vital Sign    Worried About Running Out of Food in the Last Year: Never true    Ran Out of Food in the Last Year: Never true  Transportation Needs: No Transportation Needs (12/21/2022)   PRAPARE - Administrator, Civil Service (Medical): No    Lack of Transportation (Non-Medical): No  Physical Activity: Insufficiently Active (12/21/2022)   Exercise Vital Sign    Days of Exercise per Week: 2 days    Minutes of Exercise per Session: 30 min  Stress: No Stress Concern Present (12/21/2022)   [employer] of Occupational Health - Occupational Stress Questionnaire    Feeling of Stress : Only a little  Social Connections: Socially Integrated (12/21/2022)   Social Connection and Isolation Panel [NHANES]    Frequency of Communication with Friends and Family: More than three times a week    Frequency of Social Gatherings with Friends and Family: Three times a week    Attends Religious Services: More than 4 times per year    Active Member of Clubs or Organizations: Yes    Attends Banker Meetings: 1 to 4 times per year     Marital Status: Married  Catering manager Violence: Not At Risk (06/10/2022)   Humiliation, Afraid, Rape, and Kick questionnaire    Fear of Current or Ex-Partner: No    Emotionally Abused: No    Physically Abused: No    Sexually Abused: No    SUBJECTIVE  Review of Systems Constitutional: Patient denies any unintentional weight loss or change in strength lntegumentary: Patient denies any rashes or pruritus Cardiovascular: Patient denies chest pain or syncope Respiratory: Patient denies shortness of breath Gastrointestinal: Patient denies constipation Musculoskeletal: Patient denies muscle cramps or weakness Neurologic:  Patient denies convulsions or seizures Allergic/Immunologic: Patient denies recent allergic reaction(s) Hematologic/Lymphatic: Patient denies bleeding tendencies Endocrine: Patient denies heat/cold intolerance  GU: As per HPI.  OBJECTIVE Vitals:   07/01/23 1313  BP: 103/63  Pulse: 62   There is no height or weight on file to calculate BMI.  Physical Examination Constitutional: No obvious distress; patient is non-toxic appearing  Cardiovascular: No visible lower extremity edema.  Respiratory: The patient does not have audible wheezing/stridor; respirations do not appear labored  Gastrointestinal: Abdomen non-distended Musculoskeletal: Normal ROM of UEs  Skin: No obvious rashes/open sores  Neurologic: CN 2-12 grossly intact Psychiatric: Answered questions appropriately with normal affect  Hematologic/Lymphatic/Immunologic: No obvious bruises or sites of spontaneous bleeding  UA: glucosuria, otherwise unremarkable PVR: 13 ml  ASSESSMENT Urinary frequency - Plan: Urinalysis, Routine w reflex microscopic, BLADDER SCAN AMB NON-IMAGING, mirabegron  ER (MYRBETRIQ ) 25 MG TB24 tablet  Caffeine use  Smoker  OAB (overactive bladder) - Plan: mirabegron  ER (MYRBETRIQ ) 25 MG TB24 tablet  Urinary urgency - Plan: mirabegron  ER (MYRBETRIQ ) 25 MG TB24  tablet  Nocturia - Plan: mirabegron  ER (MYRBETRIQ ) 25 MG TB24 tablet  No evidence of bladder outlet obstruction from BPH based on normal PVR today.  We discussed the symptoms of overactive bladder (OAB), which include urinary urgency, frequency, nocturia, with or without urge incontinence.  While we may not know the exact etiology of OAB, several risk factors can be identified.  - Patient's neurogenic risk factors: T2DM, nicotine use.  - Patient's exacerbating factors include: caffeine intake, glucosuria (due to Empagliflozin  use).   We discussed the following management options in detail including potential benefits, risks, and side effects: Behavioral therapy: Modify fluid intake Minimize / avoid bladder irritants (such as caffeine, spicy foods, acidic foods, alcohol) Bladder retraining / timed voiding Double voiding Medication(s): - Not a safe candidate for anticholinergic medications due to risk for side effects based on patient's age and comorbidities.  - Beta-3 agonist medications: We discussed potential side effects of beta-3 agonist medications such as urinary retention and (infrequently) elevated blood pressure.   Consider discussing possible alternatives to Empagliflozin  with prescribing provider. This medication causes excess sugar to be excreted into the urine. That can prompt the kidneys to put out more water to dilute that sugar in the urine and it can also irritate the bladder lining, both of which may contribute to OAB symptoms (urinary frequency, urgency, and urge incontinence).  He decided to proceed with Myrbetriq  (Mirabegron ) 25 mg daily and minimizing / avoiding caffeine intake.  Will plan for follow up in 8 weeks or sooner if needed. Pt verbalized understanding and agreement. All questions were answered.   PLAN Advised the following: 1. Start Myrbetriq  (Mirabegron ) 25 mg daily. 2. Minimize / avoid caffeine intake. 3. Continue to work on blood sugar  management. 4. Return in about 8 weeks (around 08/26/2023) for UA, PVR, & f/u with Griselda Lederer NP.  Orders Placed This Encounter  Procedures   Urinalysis, Routine w reflex microscopic   BLADDER SCAN AMB NON-IMAGING    It has been explained that the patient is to follow regularly with their PCP in addition to all other providers involved in their care and to follow instructions provided by these respective offices. Patient advised to contact urology clinic if any urologic-pertaining questions, concerns, new symptoms or problems arise in the interim period.  Patient Instructions  Overactive bladder (OAB) overview for patients:  Symptoms may include: urinary urgency ("gotta go" feeling) urinary frequency (voiding >8 times per day)  night time urination (nocturia) urge incontinence of urine (UUI)  While we do not know the exact etiology of OAB, several treatment options exist including:  Behavioral therapy: Reducing fluid intake Decreasing bladder stimulants (such as caffeine) and irritants (such as acidic food, spicy foods, alcohol) Urge suppression strategies Bladder retraining via timed voiding  Pelvic floor physical therapy  Medication(s) - can use one or both of the drug classes below. Anticholinergic / antimuscarinic medications:  Mechanism of action: Activate M3 receptors to reduce detrusor stimulation and increase bladder capacity   (parasympathetic nervous system). Effect: Relaxes the bladder to decrease overactivity, increase bladder storage capacity, and increase time between voids. Onset: Slow acting (may take 8-12 weeks to determine efficacy). Medications include: Vesicare (Solifenacin), Ditropan (Oxybutynin), Detrol (Tolterodine), Toviaz (Fesoterodine), Sanctura (Trospium), Urispas (Flavoxate), Enablex (Darifenacin), Bentyl (Dicyclomine), Levsin (Hyoscyamine  ). Potential side effects include but are not limited to: Dry eyes, dry mouth, constipation, cognitive  impairment, dementia risk with long term use, and urinary retention/ incomplete bladder emptying. Insurance companies generally prefer for patients to try 1-2 anticholinergic / antimuscarinic medications first due to low cost. Some exceptions are made based on patient-specific comorbidities / risk factors. Beta-3 agonist medications: Mechanism of action: Stimulates selective B3 adrenergic receptors to cause smooth muscle bladder relaxation (sympathetic nervous system). Effect: Relaxes the bladder to decrease overactivity, increase bladder storage capacity, and increase time between voids. Onset: Slow acting (may take 8-12 weeks to determine efficacy). Medications include: Myrbetriq  (Mirabegron ) and Vibegron  (Gemtesa ). Potential side effects include but are not limited to: urinary retention / incomplete bladder emptying and elevated blood pressure (more likely to occur in individuals with pre-existing uncontrolled hypertension). These medications tend to be more expensive than the anticholinergic / antimuscarinic medications.   For patients with refractory OAB (if the above treatment options have been unsuccessful): Posterior tibial nerve stimulation (PTNS). Small acupuncture-type needle inserted near ankle with electric current to stimulate bladder via posterior tibial nerve pathway. Initially requires 12 weekly in-office treatments lasting 30 minutes each; followed by monthly in-office treatments lasting 30 minutes each for 1 year.  Bladder Botox injections. How it is done: Typically done via in-office cystoscopy; sometimes done in the OR depending on the situation. The bladder is numbed with lidocaine  instilled via a catheter. Then the urologist injects Botox into the bladder muscle wall in about 20 locations. Causes local paralysis of the bladder muscle at the injection sites to reduce bladder muscle overactivity / spasms. The effect lasts for approximately 6 months and cannot be reversed once  performed. Risks may included but are not limited to: infection, incomplete bladder emptying/ urinary retention, short term need for self-catheterization or indwelling catheter, and need for repeat therapy. There is a 5-12% chance of needing to catheterize with Botox - that usually resolves in a few months as the Botox wears off. Typically Botox injections would need to be repeated every 3-12 months since this is not a permanent therapy.  Sacral neuromodulation trial (Medtronic lnterStim or Axonics implant). Sacral neuromodulation is FDA-approved for uncontrolled urinary urgency, urinary frequency, urinary urge incontinence, non-obstructive urinary retention, or fecal incontinence. It is not FDA-approved as a treatment for pain. The goal of this therapy is at least a 50% improvement in symptoms. It is NOT realistic to expect a 100% cure. This is a a 2-step outpatient procedure. After a successful test period, a permanent wire and generator are placed in the OR. We discussed the risk of infection. We reviewed the fact that about 30% of patients fail the  test phase and are not candidates for permanent generator placement. During the 1-2 week trial phase, symptoms are documented by the patient to determine response. If patient gets at least a 50% improvement in symptoms, they may then proceed with Step 2. Step 1: Trial lead placement. Per physician discretion, may done one of two ways: Percutaneous nerve evaluation (PNE) in the [facility] urology office. Performed by urologist under local anesthesia (numbing the area with lidocaine ) using a spinal needle for placement of test wire, which usually stays in place for 5-7 days to determine therapy response. Test lead placement in OR under anesthesia. Usually stays in place 2 weeks to determine therapy response. > Step 2: Permanent implantation of sacral neuromodulation device, which is performed in the OR.  Sacral neuromodulation implants: All are conditionally  MRI safe. Manufacturer: Medtronic Website: [employer]gl therapy/right-for-you.html Options: lnterStim X: Non-rechargeable. The battery lasts 10 years on average. lnterStim Micro: Rechargeable. The battery lasts 15 years on average and must be charged routinely. Approximately 50% smaller implant than lnterStim X implant.  Manufacturer: Axonics Website: Findrealrelief.axonics.com Options: Non-rechargeable (Axonics F15): The battery lasts 15 years on average. Rechargeable (Axonics R20): The battery lasts 20 years on average and must be charged in office for about 1 hour every 6-10 months on average. Approximately 50% smaller implant than Axonics non-rechargeable implant.  Note: Generally the rechargeable devices are only advised for very small or thin patients who may not have sufficient adipose tissue to comfortably overlay the implanted device.  Suprapubic catheter (SP tube) placement. Only done in severely refractory OAB when all other options have failed or are not a viable treatment choice depending on patient factors. Involves placement of a catheter through the lower abdomen into the bladder to continuously drain the bladder into an external collection bag, which patient can then empty at their convenience every few hours. Done via an outpatient surgical procedure in the OR under anesthesia. Risks may included but are not limited to: surgical site pain, infections, skin irritation / breakdown, chronic bacteriuria, symptomatic UTls. The SP tube must stay in place continuously. This is a reversible procedure however - the insertion site will close if catheter is removed for more than a few hours. The SP tube must be exchanged routinely every 4 weeks to prevent the catheter from becoming clogged with sediment. SP tube exchanges are typically performed at a urology nurse visit or by a home health  nurse.   Electronically signed by:  Lauretta Ponto, MSN, FNP-C, CUNP 07/01/2023 2:04 PM

## 2023-07-01 NOTE — Patient Instructions (Signed)

## 2023-07-02 NOTE — Telephone Encounter (Signed)
 Please let pt know that I have sent in Gemtesa  (Vibegron ) 75 mg daily as an alternative to Myrbetriq  (Mirabegron ) 25 mg daily, however if Myrbetriq  was too expensive then Gemtesa  may also be cost prohibitive. If so, please contact his insurance company to request coverage denial appeal. Not a safe candidate for anticholinergic medications due to risk for side effects based on patient's age and comorbidities.

## 2023-07-30 ENCOUNTER — Other Ambulatory Visit: Payer: Self-pay | Admitting: Student

## 2023-08-11 ENCOUNTER — Ambulatory Visit

## 2023-08-11 VITALS — BP 109/55 | HR 72 | Ht 70.0 in | Wt 229.0 lb

## 2023-08-11 DIAGNOSIS — Z0000 Encounter for general adult medical examination without abnormal findings: Secondary | ICD-10-CM | POA: Diagnosis not present

## 2023-08-11 DIAGNOSIS — E119 Type 2 diabetes mellitus without complications: Secondary | ICD-10-CM

## 2023-08-11 NOTE — Progress Notes (Signed)
 Please attest and cosign this visit due to patients primary care provider not being in the office at the time the visit was completed.   Subjective:   Bradley Acevedo is a 78 y.o. who presents for a Medicare Wellness preventive visit.  As a reminder, Annual Wellness Visits don't include a physical exam, and some assessments may be limited, especially if this visit is performed virtually. We may recommend an in-person follow-up visit with your provider if needed.  Visit Complete: Virtual I connected with  Bradley Acevedo on 08/11/23 by a audio enabled telemedicine application and verified that I am speaking with the correct person using two identifiers.  Patient Location: Home  Provider Location: Home Office  I discussed the limitations of evaluation and management by telemedicine. The patient expressed understanding and agreed to proceed.  Vital Signs: Because this visit was a virtual/telehealth visit, some criteria may be missing or patient reported. Any vitals not documented were not able to be obtained and vitals that have been documented are patient reported.  VideoDeclined- This patient declined Librarian, academic. Therefore the visit was completed with audio only.  Persons Participating in Visit: Patient.  AWV Questionnaire: Yes: Patient Medicare AWV questionnaire was completed by the patient on 08/10/2023; I have confirmed that all information answered by patient is correct and no changes since this date.  Cardiac Risk Factors include: advanced age (>80men, >41 women);diabetes mellitus;hypertension;Other (see comment), Risk factor comments: CAD     Objective:     Today's Vitals   08/11/23 1419 08/11/23 1420  BP: (!) 109/55   Pulse: 72   SpO2: 96%   Weight: 229 lb (103.9 kg)   Height: 5\' 10"  (1.778 m)   PainSc:  3    Body mass index is 32.86 kg/m.     08/11/2023    2:30 PM 07/12/2022   10:18 AM 06/10/2022    8:17 AM 02/27/2021    6:18 AM  02/26/2021    7:30 AM 07/25/2020    5:52 AM 06/28/2020   12:42 PM  Advanced Directives  Does Patient Have a Medical Advance Directive? Yes No Yes No No Yes No  Type of Estate agent of [place];Living will     Healthcare Power of [place];Living will   Does patient want to make changes to medical advance directive? No - Patient declined  Yes (Inpatient - patient defers changing a medical advance directive at this time - Information given)   No - Patient declined   Copy of Healthcare Power of Attorney in Chart? Yes - validated most recent copy scanned in chart (See row information)     No - copy requested   Would patient like information on creating a medical advance directive?  No - Patient declined Yes (Inpatient - patient defers creating a medical advance directive at this time - Information given) No - Patient declined No - Patient declined  No - Patient declined    Current Medications (verified) Outpatient Encounter Medications as of 08/11/2023  Medication Sig   amLODipine  (NORVASC ) 5 MG tablet TAKE 1/2 TABLET BY MOUTH ONCE DAILY   aspirin  EC 81 MG EC tablet Take 1 tablet (81 mg total) by mouth daily. Swallow whole. (Patient taking differently: Take 81 mg by mouth in the morning. Swallow whole.)   Cholecalciferol (D3 DOTS) 50 MCG (2000 UT) TBDP Take 2,000 Units by mouth in the morning.   diphenhydrAMINE (BENADRYL) 25 MG tablet Take 25 mg by mouth daily.   Empagliflozin -metFORMIN   HCl 12.07-998 MG TABS Take 1 tablet by mouth daily.   glipiZIDE  (GLUCOTROL ) 5 MG tablet Take 1 tablet (5 mg total) by mouth 2 (two) times daily before a meal.   isosorbide  mononitrate (IMDUR ) 60 MG 24 hr tablet TAKE 1 TABLET IN THE       MORNING AND AT BEDTIME   levothyroxine  (SYNTHROID ) 75 MCG tablet Take 1 tablet (75 mcg total) by mouth daily before breakfast.   lisinopril  (ZESTRIL ) 10 MG tablet TAKE 1 TABLET EVERY MORNING   metoprolol  succinate (TOPROL -XL) 50 MG 24 hr tablet TAKE 1 TABLET BY  MOUTH EVERY DAY WITH OR IMMEDIATELY FOLLOWING A MEAL   mirabegron  ER (MYRBETRIQ ) 25 MG TB24 tablet Take 1 tablet (25 mg total) by mouth daily.   nitroGLYCERIN  (NITROSTAT ) 0.4 MG SL tablet Place 1 tablet (0.4 mg total) under the tongue every 5 (five) minutes x 3 doses as needed for chest pain.   Omega-3 Fatty Acids (FISH OIL) 1000 MG CAPS Take 1 capsule by mouth daily.   ondansetron  (ZOFRAN -ODT) 4 MG disintegrating tablet Take 4 mg by mouth every 8 (eight) hours as needed for nausea or vomiting.   pantoprazole  (PROTONIX ) 40 MG tablet Take 40 mg by mouth daily.   Probiotic Product (PROBIOTIC PO) Take 1 capsule by mouth in the morning.   rosuvastatin  (CRESTOR ) 40 MG tablet Take 1 tablet (40 mg total) by mouth daily.   traZODone  (DESYREL ) 50 MG tablet Take 1 tablet (50 mg total) by mouth at bedtime as needed.   Vibegron  (GEMTESA ) 75 MG TABS Take 1 tablet (75 mg total) by mouth daily.   No facility-administered encounter medications on file as of 08/11/2023.    Allergies (verified) Patient has no known allergies.   History: Past Medical History:  Diagnosis Date   Acid reflux    CAD (coronary artery disease)    Moderate LAD disease May 2022 managed medically   Diverticulosis    Hypertension    Hypothyroid    IBS (irritable bowel syndrome)    Type 2 diabetes mellitus [facility])    Past Surgical History:  Procedure Laterality Date   CIRCUMCISION     COLONOSCOPY     COLONOSCOPY  06/25/2016   [place], [place] by Dr. Rosanna Comment   CORONARY PRESSURE/FFR STUDY N/A 07/25/2020   Procedure: INTRAVASCULAR PRESSURE WIRE/FFR STUDY;  Surgeon: Avanell Leigh, MD;  Location: [facility];  Service: Cardiovascular;  Laterality: N/A;   LEFT HEART CATH AND CORONARY ANGIOGRAPHY N/A 07/25/2020   Procedure: LEFT HEART CATH AND CORONARY ANGIOGRAPHY;  Surgeon: Avanell Leigh, MD;  Location: [facility];  Service: Cardiovascular;  Laterality: N/A;   meniscus left knee     MENISCUS REPAIR Left 05/28/2017    [place] by Dr. Brenita Callow   TONSILLECTOMY  1953   [place], [place]   Family History  Problem Relation Age of Onset   Cancer Mother    Hypertension Mother    Hyperlipidemia Mother    Hyperlipidemia Father    Hypertension Father    Arthritis Father    Social History   Socioeconomic History   Marital status: Married    Spouse name: Not on file   Number of children: Not on file   Years of education: Not on file   Highest education level: Associate degree: occupational, Scientist, product/process development, or vocational program  Occupational History   Not on file  Tobacco Use   Smoking status: Former    Current packs/day: 0.00    Types: Cigarettes  Start date: 47    Quit date: 01/26/2006    Years since quitting: 17.5   Smokeless tobacco: Never  Vaping Use   Vaping status: Never Used  Substance and Sexual Activity   Alcohol use: Not Currently    Comment: rarely   Drug use: Never   Sexual activity: Not Currently    Birth control/protection: Abstinence    Comment: ED  Other Topics Concern   Not on file  Social History Narrative   Not on file   Social Drivers of Health   Financial Resource Strain: Low Risk  (08/10/2023)   Overall Financial Resource Strain (CARDIA)    Difficulty of Paying Living Expenses: Not hard at all  Food Insecurity: No Food Insecurity (08/10/2023)   Hunger Vital Sign    Worried About Running Out of Food in the Last Year: Never true    Ran Out of Food in the Last Year: Never true  Transportation Needs: No Transportation Needs (08/10/2023)   PRAPARE - Administrator, Civil Service (Medical): No    Lack of Transportation (Non-Medical): No  Physical Activity: Insufficiently Active (08/10/2023)   Exercise Vital Sign    Days of Exercise per Week: 1 day    Minutes of Exercise per Session: 20 min  Stress: Stress Concern Present (08/10/2023)   [employer] of Occupational Health - Occupational Stress Questionnaire    Feeling of Stress : To some extent  Social  Connections: Socially Integrated (08/10/2023)   Social Connection and Isolation Panel [NHANES]    Frequency of Communication with Friends and Family: More than three times a week    Frequency of Social Gatherings with Friends and Family: Three times a week    Attends Religious Services: More than 4 times per year    Active Member of Clubs or Organizations: Yes    Attends Engineer, structural: More than 4 times per year    Marital Status: Married    Tobacco Counseling Counseling given: Yes    Clinical Intake:  Pre-visit preparation completed: Yes  Pain : 0-10 Pain Score: 3  Pain Type: Acute pain Pain Location: Back Pain Orientation: Lower Pain Descriptors / Indicators: Aching Pain Onset: More than a month ago Pain Frequency: Intermittent (due to gardening work)     BMI - recorded: 32.86 Nutritional Status: BMI > 30  Obese Nutritional Risks: None Diabetes: Yes CBG done?: No (telehealth visit.) Did pt. bring in CBG monitor from home?: No  Lab Results  Component Value Date   HGBA1C 7.6 (H) 05/26/2023   HGBA1C 7.5 (H) 12/22/2022   HGBA1C 7.1 (H) 09/21/2022     How often do you need to have someone help you when you read instructions, pamphlets, or other written materials from your doctor or pharmacy?: 1 - Never  Interpreter Needed?: No  Information entered by :: Sally Crazier CMA   Activities of Daily Living     08/11/2023    2:22 PM  In your present state of health, do you have any difficulty performing the following activities:  Hearing? 1  Vision? 0  Difficulty concentrating or making decisions? 0  Walking or climbing stairs? 0  Dressing or bathing? 0  Doing errands, shopping? 0  Preparing Food and eating ? N  Using the Toilet? N  In the past six months, have you accidently leaked urine? N  Do you have problems with loss of bowel control? N  Managing your Medications? N  Managing your Finances? N  Housekeeping or  managing your Housekeeping? N     Patient Care Team: Del Amber Bail, Rogerio Clay, FNP as PCP - General (Family Medicine) Gerard Knight, MD as Consulting Physician (Cardiology) Clinic, Cleatrice Curl, FNP (Urology) Baby Bolt, MD as Consulting Physician (Endocrinology)  I have updated your Care Teams any recent Medical Services you may have received from other providers in the past year.     Assessment:    This is a routine wellness examination for Elchanan.  Hearing/Vision screen Hearing Screening - Comments:: Patient wears hearing aids.  Vision Screening - Comments:: Wears rx glasses - up to date with routine eye exams with  [facility]. He is scheduled to have his yearly eye exam on August 25, 2023   Goals Addressed             This Visit's Progress    Patient Stated       To get weight under 200 lbs        Depression Screen     08/11/2023    2:26 PM 05/26/2023   10:13 AM 12/22/2022    9:14 AM 09/21/2022    8:56 AM 09/21/2022    8:48 AM 06/19/2022   10:11 AM 06/10/2022    8:16 AM  PHQ 2/9 Scores  PHQ - 2 Score 0 0 0 1 0 0 0  PHQ- 9 Score 6 0 0  0      Fall Risk     08/11/2023    2:32 PM 05/26/2023   10:13 AM 12/22/2022    9:14 AM 09/21/2022    8:48 AM 06/19/2022   10:11 AM  Fall Risk   Falls in the past year? 0 0 0 0 0  Number falls in past yr: 0 0 0 0 0  Injury with Fall? 0 0 0 0 0  Risk for fall due to : No Fall Risks No Fall Risks No Fall Risks No Fall Risks No Fall Risks  Follow up Falls evaluation completed Falls evaluation completed Falls evaluation completed Falls evaluation completed Falls evaluation completed    MEDICARE RISK AT HOME:  Medicare Risk at Home Any stairs in or around the home?: Yes If so, are there any without handrails?: No Home free of loose throw rugs in walkways, pet beds, electrical cords, etc?: Yes Adequate lighting in your home to reduce risk of falls?: Yes Life alert?: No Use of a cane, walker or w/c?: No Grab bars in the  bathroom?: Yes Shower chair or bench in shower?: Yes Elevated toilet seat or a handicapped toilet?: Yes  TIMED UP AND GO:  Was the test performed?  No  Cognitive Function: 6CIT completed        08/11/2023    2:33 PM 06/10/2022    8:18 AM  6CIT Screen  What Year? 0 points 0 points  What month? 0 points 0 points  What time? 0 points 0 points  Count back from 20 0 points 0 points  Months in reverse 0 points 0 points  Repeat phrase 0 points 0 points  Total Score 0 points 0 points    Immunizations Immunization History  Administered Date(s) Administered   Fluad Trivalent(High Dose 65+) 12/22/2022   Influenza Split 01/07/2017   Influenza-Unspecified 12/26/2019, 12/02/2020   Moderna Covid-19 Vaccine Bivalent Booster 36yrs & up 12/30/2020   Moderna Sars-Covid-2 Vaccination 04/20/2019, 05/18/2019, 01/11/2020, 05/16/2020   Pneumococcal Conjugate-13 07/13/2016   Pneumococcal Polysaccharide-23 12/22/2018   Td (Adult),5 Lf Tetanus Toxid, Preservative Free 02/16/2023  Tdap 02/07/2011, 02/17/2021   Zoster Recombinant(Shingrix) 04/20/2019, 05/18/2019, 07/03/2019, 09/04/2019    Screening Tests Health Maintenance  Topic Date Due   COVID-19 Vaccine (6 - 2024-25 season) 11/08/2022   Diabetic kidney evaluation - Urine ACR  06/19/2023   FOOT EXAM  06/19/2023   OPHTHALMOLOGY EXAM  08/27/2023   INFLUENZA VACCINE  10/08/2023   HEMOGLOBIN A1C  11/26/2023   Diabetic kidney evaluation - eGFR measurement  05/25/2024   Medicare Annual Wellness (AWV)  08/10/2024   Fecal DNA (Cologuard)  06/15/2025   DTaP/Tdap/Td (4 - Td or Tdap) 02/15/2033   Pneumonia Vaccine 71+ Years old  Completed   Hepatitis C Screening  Completed   Zoster Vaccines- Shingrix  Completed   HPV VACCINES  Aged Out   Meningococcal B Vaccine  Aged Out    Health Maintenance  Health Maintenance Due  Topic Date Due   COVID-19 Vaccine (6 - 2024-25 season) 11/08/2022   Diabetic kidney evaluation - Urine ACR  06/19/2023   FOOT  EXAM  06/19/2023   Health Maintenance Items Addressed: Labs Ordered: Diabetic  Kidney Evaluation Uacr  Additional Screening:  Vision Screening: Recommended annual ophthalmology exams for early detection of glaucoma and other disorders of the eye. Would you like a referral to an eye doctor? No    Dental Screening: Recommended annual dental exams for proper oral hygiene  Community Resource Referral / Chronic Care Management: CRR required this visit?  No   CCM required this visit?  No   Plan:    I have personally reviewed and noted the following in the patient's chart:   Medical and social history Use of alcohol, tobacco or illicit drugs  Current medications and supplements including opioid prescriptions. Patient is not currently taking opioid prescriptions. Functional ability and status Nutritional status Physical activity Advanced directives List of other physicians Hospitalizations, surgeries, and ER visits in previous 12 months Vitals Screenings to include cognitive, depression, and falls Referrals and appointments  In addition, I have reviewed and discussed with patient certain preventive protocols, quality metrics, and best practice recommendations. A written personalized care plan for preventive services as well as general preventive health recommendations were provided to patient.   Shellie Goettl, CMA   08/11/2023   After Visit Summary: (MyChart) Due to this being a telephonic visit, the after visit summary with patients personalized plan was offered to patient via MyChart   Notes: Nothing significant to report at this time.

## 2023-08-11 NOTE — Patient Instructions (Signed)
 Bradley Acevedo , Thank you for taking time out of your busy schedule to complete your Annual Wellness Visit with me. I enjoyed our conversation and look forward to speaking with you again next year. I, as well as your care team,  appreciate your ongoing commitment to your health goals. Please review the following plan we discussed and let me know if I can assist you in the future. Your Game plan/ To Do List    Referrals: If you haven't heard from the office you've been referred to, please reach out to them at the phone number provided.   You need to have a Diabetic Kidney Function test completed for your diabetes. The order is already in. Please have this done at the same pharmacy as your routine labs from your primary care provider.   Follow up Visits: Next Medicare AWV with our clinical staff:   August 15, 2024 at 8:40 am telephone Have you seen your provider in the last 6 months (3 months if uncontrolled diabetes)? Yes Next Office Visit with your provider:   October 01, 2023 at 9:00am  Clinician Recommendations:   Aim for 30 minutes of exercise or brisk walking, 6-8 glasses of water, and 5 servings of fruits and vegetables each day.  I enjoyed our conversation today and look forward to talking with you again next year!! Have a wonderful and safe year. All the best, Aleathea Pugmire This is a list of the screening recommended for you and due dates:  Health Maintenance  Topic Date Due   COVID-19 Vaccine (6 - 2024-25 season) 11/08/2022   Yearly kidney health urinalysis for diabetes  06/19/2023   Complete foot exam   06/19/2023   Eye exam for diabetics  08/27/2023   Flu Shot  10/08/2023   Hemoglobin A1C  11/26/2023   Yearly kidney function blood test for diabetes  05/25/2024   Medicare Annual Wellness Visit  08/10/2024   Cologuard (Stool DNA test)  06/15/2025   DTaP/Tdap/Td vaccine (4 - Td or Tdap) 02/15/2033   Pneumonia Vaccine  Completed   Hepatitis C Screening  Completed   Zoster (Shingles) Vaccine   Completed   HPV Vaccine  Aged Out   Meningitis B Vaccine  Aged Out    Advanced directives: (In Chart) A copy of your advanced directives are scanned into your chart should your provider ever need it. Advance Care Planning is important because it:  [x]  Makes sure you receive the medical care that is consistent with your values, goals, and preferences  [x]  It provides guidance to your family and loved ones and reduces their decisional burden about whether or not they are making the right decisions based on your wishes.  Follow the link provided in your after visit summary or read over the paperwork we have mailed to you to help you started getting your Advance Directives in place. If you need assistance in completing these, please reach out to us  so that we can help you! See attachments for Preventive Care and Fall Prevention Tips.  Understanding Your Risk for Falls Millions of people have serious injuries from falls each year. It is important to understand your risk of falling. Talk with your health care provider about your risk and what you can do to lower it. If you do have a serious fall, make sure to tell your provider. Falling once raises your risk of falling again. How can falls affect me? Serious injuries from falls are common. These include: Broken bones, such as hip fractures. Head  injuries, such as traumatic brain injuries (TBI) or concussions. A fear of falling can cause you to avoid activities and stay at home. This can make your muscles weaker and raise your risk for a fall. What can increase my risk? There are a number of risk factors that increase your risk for falling. The more risk factors you have, the higher your risk of falling. Serious injuries from a fall happen most often to people who are older than 77 years old. Teenagers and young adults ages 33-29 are also at higher risk. Common risk factors include: Weakness in the lower body. Being generally weak or confused due  to long-term (chronic) illness. Dizziness or balance problems. Poor vision. Medicines that cause dizziness or drowsiness. These may include: Medicines for your blood pressure, heart, anxiety, insomnia, or swelling (edema). Pain medicines. Muscle relaxants. Other risk factors include: Drinking alcohol. Having had a fall in the past. Having foot pain or wearing improper footwear. Working at a dangerous job. Having any of the following in your home: Tripping hazards, such as floor clutter or loose rugs. Poor lighting. Pets. Having dementia or memory loss. What actions can I take to lower my risk of falling?     Physical activity Stay physically fit. Do strength and balance exercises. Consider taking a regular class to build strength and balance. Yoga and tai chi are good options. Vision Have your eyes checked every year and your prescription for glasses or contacts updated as needed. Shoes and walking aids Wear non-skid shoes. Wear shoes that have rubber soles and low heels. Do not wear high heels. Do not walk around the house in socks or slippers. Use a cane or walker as told by your provider. Home safety Attach secure railings on both sides of your stairs. Install grab bars for your bathtub, shower, and toilet. Use a non-skid mat in your bathtub or shower. Attach bath mats securely with double-sided, non-slip rug tape. Use good lighting in all rooms. Keep a flashlight near your bed. Make sure there is a clear path from your bed to the bathroom. Use night-lights. Do not use throw rugs. Make sure all carpeting is taped or tacked down securely. Remove all clutter from walkways and stairways, including extension cords. Repair uneven or broken steps and floors. Avoid walking on icy or slippery surfaces. Walk on the grass instead of on icy or slick sidewalks. Use ice melter to get rid of ice on walkways in the winter. Use a cordless phone. Questions to ask your health care  provider Can you help me check my risk for a fall? Do any of my medicines make me more likely to fall? Should I take a vitamin D  supplement? What exercises can I do to improve my strength and balance? Should I make an appointment to have my vision checked? Do I need a bone density test to check for weak bones (osteoporosis)? Would it help to use a cane or a walker? Where to find more information Centers for Disease Control and Prevention, STEADI: TonerPromos.no Community-Based Fall Prevention Programs: TonerPromos.no [employer] on Aging: BaseRingTones.pl Contact a health care provider if: You fall at home. You are afraid of falling at home. You feel weak, drowsy, or dizzy. This information is not intended to replace advice given to you by your health care provider. Make sure you discuss any questions you have with your health care provider. Document Revised: 10/27/2021 Document Reviewed: 10/27/2021 Elsevier Patient Education  2024 [employer].  Understanding Your Risk for Falls  Millions of people have serious injuries from falls each year. It is important to understand your risk of falling. Talk with your health care provider about your risk and what you can do to lower it. If you do have a serious fall, make sure to tell your provider. Falling once raises your risk of falling again. How can falls affect me? Serious injuries from falls are common. These include: Broken bones, such as hip fractures. Head injuries, such as traumatic brain injuries (TBI) or concussions. A fear of falling can cause you to avoid activities and stay at home. This can make your muscles weaker and raise your risk for a fall. What can increase my risk? There are a number of risk factors that increase your risk for falling. The more risk factors you have, the higher your risk of falling. Serious injuries from a fall happen most often to people who are older than 77 years old. Teenagers and young adults ages 79-29 are also  at higher risk. Common risk factors include: Weakness in the lower body. Being generally weak or confused due to long-term (chronic) illness. Dizziness or balance problems. Poor vision. Medicines that cause dizziness or drowsiness. These may include: Medicines for your blood pressure, heart, anxiety, insomnia, or swelling (edema). Pain medicines. Muscle relaxants. Other risk factors include: Drinking alcohol. Having had a fall in the past. Having foot pain or wearing improper footwear. Working at a dangerous job. Having any of the following in your home: Tripping hazards, such as floor clutter or loose rugs. Poor lighting. Pets. Having dementia or memory loss. What actions can I take to lower my risk of falling?     Physical activity Stay physically fit. Do strength and balance exercises. Consider taking a regular class to build strength and balance. Yoga and tai chi are good options. Vision Have your eyes checked every year and your prescription for glasses or contacts updated as needed. Shoes and walking aids Wear non-skid shoes. Wear shoes that have rubber soles and low heels. Do not wear high heels. Do not walk around the house in socks or slippers. Use a cane or walker as told by your provider. Home safety Attach secure railings on both sides of your stairs. Install grab bars for your bathtub, shower, and toilet. Use a non-skid mat in your bathtub or shower. Attach bath mats securely with double-sided, non-slip rug tape. Use good lighting in all rooms. Keep a flashlight near your bed. Make sure there is a clear path from your bed to the bathroom. Use night-lights. Do not use throw rugs. Make sure all carpeting is taped or tacked down securely. Remove all clutter from walkways and stairways, including extension cords. Repair uneven or broken steps and floors. Avoid walking on icy or slippery surfaces. Walk on the grass instead of on icy or slick sidewalks. Use ice melter  to get rid of ice on walkways in the winter. Use a cordless phone. Questions to ask your health care provider Can you help me check my risk for a fall? Do any of my medicines make me more likely to fall? Should I take a vitamin D  supplement? What exercises can I do to improve my strength and balance? Should I make an appointment to have my vision checked? Do I need a bone density test to check for weak bones (osteoporosis)? Would it help to use a cane or a walker? Where to find more information Centers for Disease Control and Prevention, STEADI: TonerPromos.no Community-Based Fall Prevention Programs:  TonerPromos.no [employer] on Aging: BaseRingTones.pl Contact a health care provider if: You fall at home. You are afraid of falling at home. You feel weak, drowsy, or dizzy. This information is not intended to replace advice given to you by your health care provider. Make sure you discuss any questions you have with your health care provider. Document Revised: 10/27/2021 Document Reviewed: 10/27/2021 Elsevier Patient Education  2024 [employer].

## 2023-08-13 ENCOUNTER — Other Ambulatory Visit: Payer: Self-pay | Admitting: Family Medicine

## 2023-08-25 LAB — HM DIABETES EYE EXAM

## 2023-08-26 ENCOUNTER — Ambulatory Visit: Admitting: Urology

## 2023-09-07 ENCOUNTER — Other Ambulatory Visit: Payer: Self-pay | Admitting: "Endocrinology

## 2023-09-07 ENCOUNTER — Other Ambulatory Visit: Payer: Self-pay | Admitting: Family Medicine

## 2023-09-15 ENCOUNTER — Encounter: Payer: Self-pay | Admitting: Cardiology

## 2023-09-15 ENCOUNTER — Ambulatory Visit: Attending: Cardiology | Admitting: Cardiology

## 2023-09-15 VITALS — BP 116/56 | HR 66 | Ht 70.0 in | Wt 227.0 lb

## 2023-09-15 DIAGNOSIS — E782 Mixed hyperlipidemia: Secondary | ICD-10-CM | POA: Diagnosis not present

## 2023-09-15 DIAGNOSIS — I10 Essential (primary) hypertension: Secondary | ICD-10-CM | POA: Diagnosis present

## 2023-09-15 DIAGNOSIS — I25119 Atherosclerotic heart disease of native coronary artery with unspecified angina pectoris: Secondary | ICD-10-CM | POA: Insufficient documentation

## 2023-09-15 NOTE — Progress Notes (Signed)
    Cardiology Office Note  Date: 09/15/2023   ID: Bradley Acevedo, DOB 1945/05/05, MRN [id number]  History of Present Illness: Bradley Acevedo is a 78 y.o. male last seen in July 2024.  He is here for a routine visit.  States that he has had a few episodes of chest tightness and diaphoresis, not always with activity, resolves with a single nitroglycerin  fairly quickly.  No associated dizziness or syncope.  Most times he is able to work regularly in his garden without limitation or exertional symptoms however.  He has used nitroglycerin  approximately 2-3 times in the last 6 months.  We went over his medications.  He reports compliance with current regimen, no obvious intolerances.  LDL was 30 back in March.  I reviewed his ECG today which shows normal sinus rhythm.  Physical Exam: VS:  BP (!) 116/56 (BP Location: Left Arm, Patient Position: Sitting, Cuff Size: Normal)   Pulse 66   Ht 5' 10 (1.778 m)   Wt 227 lb (103 kg)   SpO2 97%   BMI 32.57 kg/m , BMI Body mass index is 32.57 kg/m.  Wt Readings from Last 3 Encounters:  09/15/23 227 lb (103 kg)  08/11/23 229 lb (103.9 kg)  05/26/23 240 lb 0.6 oz (108.9 kg)    General: Patient appears comfortable at rest. HEENT: Conjunctiva and lids normal. Neck: Supple, no elevated JVP or carotid bruits. Lungs: Clear to auscultation, nonlabored breathing at rest. Cardiac: Regular rate and rhythm, no S3 or significant systolic murmur, no pericardial rub. Extremities: No pitting edema.  ECG:  An ECG dated 07/12/2022 was personally reviewed today and demonstrated:  Sinus rhythm with nonspecific ST changes.  Labwork: 03/30/2023: TSH 4.080 05/26/2023: BUN 22; Creatinine, Ser 1.11; Hemoglobin 14.1; Platelets 195; Potassium 5.2; Sodium 137     Component Value Date/Time   CHOL 88 (L) 05/26/2023 1115   TRIG 151 (H) 05/26/2023 1115   HDL 32 (L) 05/26/2023 1115   CHOLHDL 2.8 05/26/2023 1115   CHOLHDL 3.4 06/28/2020 1035   VLDL 20 06/28/2020 1035    LDLCALC 30 05/26/2023 1115   Other Studies Reviewed Today:  No interval cardiac testing for review today.  Assessment and Plan:  1.  CAD with moderate mid LAD stenosis by cardiac catheterization in May 2022 managed medically in light of borderline FFR.  Follow-up Lexiscan  Myoview  in May 2024 was low risk showing a small apical ischemic territory with LVEF 58%.  Intermittent angina as discussed above, no progressive pattern at this point or exertional functional limitation.  ECG is normal today.  Plan to continue medical therapy and observation.  Currently on aspirin  81 mg daily, Imdur  60 mg twice daily, Toprol -XL 50 mg daily, Crestor  40 mg daily, and as needed nitroglycerin .   2.  Primary hypertension.  Blood pressure well-controlled today.  He is also on Norvasc  5 mg daily as well as lisinopril  10 mg daily.  3.  Mixed hyperlipidemia.  LDL 30 in March.  Continue Crestor  40 mg daily.  Disposition:  Follow up 6 months.  Signed, Jayson JUDITHANN Sierras, M.D., F.A.C.C. [facility]

## 2023-09-15 NOTE — Patient Instructions (Signed)
 Medication Instructions:  Your physician recommends that you continue on your current medications as directed. Please refer to the Current Medication list given to you today.   Labwork: None today  Testing/Procedures: None today  Follow-Up: 6 months  Any Other Special Instructions Will Be Listed Below (If Applicable).  If you need a refill on your cardiac medications before your next appointment, please call your pharmacy.

## 2023-09-25 ENCOUNTER — Other Ambulatory Visit: Payer: Self-pay | Admitting: Family Medicine

## 2023-09-27 ENCOUNTER — Encounter: Payer: Self-pay | Admitting: Urology

## 2023-09-27 ENCOUNTER — Ambulatory Visit (INDEPENDENT_AMBULATORY_CARE_PROVIDER_SITE_OTHER): Admitting: Urology

## 2023-09-27 VITALS — BP 125/70 | HR 69

## 2023-09-27 DIAGNOSIS — R351 Nocturia: Secondary | ICD-10-CM | POA: Diagnosis not present

## 2023-09-27 DIAGNOSIS — N3281 Overactive bladder: Secondary | ICD-10-CM | POA: Diagnosis not present

## 2023-09-27 DIAGNOSIS — R3915 Urgency of urination: Secondary | ICD-10-CM | POA: Diagnosis not present

## 2023-09-27 DIAGNOSIS — R350 Frequency of micturition: Secondary | ICD-10-CM | POA: Diagnosis not present

## 2023-09-27 LAB — URINALYSIS, ROUTINE W REFLEX MICROSCOPIC
Bilirubin, UA: NEGATIVE
Ketones, UA: NEGATIVE
Leukocytes,UA: NEGATIVE
Nitrite, UA: NEGATIVE
Protein,UA: NEGATIVE
RBC, UA: NEGATIVE
Specific Gravity, UA: 1.015 (ref 1.005–1.030)
Urobilinogen, Ur: 0.2 mg/dL (ref 0.2–1.0)
pH, UA: 6 (ref 5.0–7.5)

## 2023-09-27 LAB — BLADDER SCAN AMB NON-IMAGING: Scan Result: 24

## 2023-09-27 MED ORDER — GEMTESA 75 MG PO TABS: 1.0000 | ORAL_TABLET | Freq: Every day | ORAL | Status: AC

## 2023-09-27 MED ORDER — MIRABEGRON ER 25 MG PO TB24: 25.0000 mg | ORAL_TABLET | Freq: Every day | ORAL | 11 refills | Status: AC

## 2023-09-27 NOTE — Patient Instructions (Signed)

## 2023-09-27 NOTE — Progress Notes (Signed)
 Name: Bradley Acevedo DOB: 05-Jul-1945 MRN: [id number]  History of Present Illness: Mr. Ahles is a 78 y.o. male who presents today for follow up visit at [facility].  Relevant History includes:  At 1st visit on 07/01/2023: - Diagnosed with OAB with urinary frequency, nocturia x1-2, urgency, and rare urge incontinence.  - Neurogenic risk factors: T2DM, nicotine use.  - Exacerbating factors: caffeine intake (drinking 3-5 caffeinated beverages daily) and glucosuria from Empagliflozin  use for diabetes.  -  Advised the following: 1. Start Myrbetriq  (Mirabegron ) 25 mg daily. 2. Minimize / avoid caffeine intake. 3. Continue to work on blood sugar management. 4. Return in about 8 weeks (around 08/26/2023) for UA, PVR, & f/u with Lauraine Oz NP.   Today: He denies filling the Myrbetriq  (Mirabegron ) prescription due to cost (copay >$400). He reports persistent / unchanged urinary frequency, nocturia x1-2, urgency with rare urge incontinence. He reports ongoing caffeine intake (coffee in the mornings).  He denies dysuria, gross hematuria, straining to void, or sensations of incomplete emptying.   Medications: Current Outpatient Medications  Medication Sig Dispense Refill   amLODipine  (NORVASC ) 5 MG tablet TAKE 1/2 TABLET BY MOUTH ONCE DAILY 45 tablet 3   aspirin  EC 81 MG EC tablet Take 1 tablet (81 mg total) by mouth daily. Swallow whole. 30 tablet 11   Cholecalciferol (D3 DOTS) 50 MCG (2000 UT) TBDP Take 2,000 Units by mouth in the morning.     Empagliflozin -metFORMIN  HCl 12.07-998 MG TABS Take 1 tablet by mouth daily.     glipiZIDE  (GLUCOTROL ) 5 MG tablet Take 1 tablet (5 mg total) by mouth 2 (two) times daily before a meal. 60 tablet 3   isosorbide  mononitrate (IMDUR ) 60 MG 24 hr tablet TAKE 1 TABLET IN THE       MORNING AND AT BEDTIME 180 tablet 0   levothyroxine  (SYNTHROID ) 75 MCG tablet TAKE 1 TABLET DAILY BEFORE BREAKFAST 90 tablet 1   lisinopril  (ZESTRIL ) 10 MG  tablet TAKE 1 TABLET EVERY MORNING 90 tablet 3   metoprolol  succinate (TOPROL -XL) 50 MG 24 hr tablet TAKE 1 TABLET DAILY, WITH  OR IMMEDIATELY FOLLOWING A MEAL 90 tablet 2   nitroGLYCERIN  (NITROSTAT ) 0.4 MG SL tablet Place 1 tablet (0.4 mg total) under the tongue every 5 (five) minutes x 3 doses as needed for chest pain. 25 tablet 3   Omega-3 Fatty Acids (FISH OIL) 1000 MG CAPS Take 1 capsule by mouth daily.     ondansetron  (ZOFRAN -ODT) 4 MG disintegrating tablet Take 4 mg by mouth every 8 (eight) hours as needed for nausea or vomiting.     pantoprazole  (PROTONIX ) 40 MG tablet Take 40 mg by mouth daily.     Probiotic Product (PROBIOTIC PO) Take 1 capsule by mouth in the morning.     rosuvastatin  (CRESTOR ) 40 MG tablet Take 1 tablet (40 mg total) by mouth daily. 90 tablet 3   traZODone  (DESYREL ) 50 MG tablet Take 1 tablet (50 mg total) by mouth at bedtime as needed. 30 tablet 3   Vibegron  (GEMTESA ) 75 MG TABS Take 1 tablet (75 mg total) by mouth daily.     mirabegron  ER (MYRBETRIQ ) 25 MG TB24 tablet Take 1 tablet (25 mg total) by mouth daily. 30 tablet 11   No current facility-administered medications for this visit.    Allergies: No Known Allergies  Past Medical History:  Diagnosis Date   Acid reflux    CAD (coronary artery disease)    Moderate LAD disease May  2022 managed medically   Diverticulosis    Hypertension    Hypothyroid    IBS (irritable bowel syndrome)    Type 2 diabetes mellitus [facility])    Past Surgical History:  Procedure Laterality Date   CIRCUMCISION     COLONOSCOPY     COLONOSCOPY  06/25/2016   [place], [place] by Dr. Geary   CORONARY PRESSURE/FFR STUDY N/A 07/25/2020   Procedure: INTRAVASCULAR PRESSURE WIRE/FFR STUDY;  Surgeon: Court Dorn PARAS, MD;  Location: [facility] INVASIVE CV LAB;  Service: Cardiovascular;  Laterality: N/A;   LEFT HEART CATH AND CORONARY ANGIOGRAPHY N/A 07/25/2020   Procedure: LEFT HEART CATH AND CORONARY ANGIOGRAPHY;  Surgeon: Court Dorn PARAS, MD;   Location: [facility] INVASIVE CV LAB;  Service: Cardiovascular;  Laterality: N/A;   meniscus left knee     MENISCUS REPAIR Left 05/28/2017   [place], [place] by Dr. Merilee   TONSILLECTOMY  1953   [place], [place]   Family History  Problem Relation Age of Onset   Cancer Mother    Hypertension Mother    Hyperlipidemia Mother    Hyperlipidemia Father    Hypertension Father    Arthritis Father    Social History   Socioeconomic History   Marital status: Married    Spouse name: Not on file   Number of children: Not on file   Years of education: Not on file   Highest education level: Associate degree: occupational, Scientist, product/process development, or vocational program  Occupational History   Not on file  Tobacco Use   Smoking status: Former    Current packs/day: 0.00    Types: Cigarettes    Start date: 29    Quit date: 01/26/2006    Years since quitting: 17.6   Smokeless tobacco: Never  Vaping Use   Vaping status: Never Used  Substance and Sexual Activity   Alcohol use: Not Currently    Comment: rarely   Drug use: Never   Sexual activity: Not Currently    Birth control/protection: Abstinence    Comment: ED  Other Topics Concern   Not on file  Social History Narrative   Not on file   Social Drivers of Health   Financial Resource Strain: Low Risk  (08/10/2023)   Overall Financial Resource Strain (CARDIA)    Difficulty of Paying Living Expenses: Not hard at all  Food Insecurity: No Food Insecurity (08/10/2023)   Hunger Vital Sign    Worried About Running Out of Food in the Last Year: Never true    Ran Out of Food in the Last Year: Never true  Transportation Needs: No Transportation Needs (08/10/2023)   PRAPARE - Administrator, Civil Service (Medical): No    Lack of Transportation (Non-Medical): No  Physical Activity: Insufficiently Active (08/10/2023)   Exercise Vital Sign    Days of Exercise per Week: 1 day    Minutes of Exercise per Session: 20 min  Stress: Stress Concern Present  (08/10/2023)   [employer] of Occupational Health - Occupational Stress Questionnaire    Feeling of Stress : To some extent  Social Connections: Socially Integrated (08/10/2023)   Social Connection and Isolation Panel    Frequency of Communication with Friends and Family: More than three times a week    Frequency of Social Gatherings with Friends and Family: Three times a week    Attends Religious Services: More than 4 times per year    Active Member of Clubs or Organizations: Yes    Attends Banker Meetings:  More than 4 times per year    Marital Status: Married  Catering manager Violence: Not At Risk (08/11/2023)   Humiliation, Afraid, Rape, and Kick questionnaire    Fear of Current or Ex-Partner: No    Emotionally Abused: No    Physically Abused: No    Sexually Abused: No    Review of Systems Constitutional: Patient denies any unintentional weight loss or change in strength lntegumentary: Patient denies any rashes or pruritus Cardiovascular: Patient denies chest pain or syncope Respiratory: Patient denies shortness of breath Gastrointestinal: Patient denies nausea, vomiting, constipation, or diarrhea  Musculoskeletal: Patient denies muscle cramps or weakness Neurologic: Patient denies convulsions or seizures Allergic/Immunologic: Patient denies recent allergic reaction(s) Hematologic/Lymphatic: Patient denies bleeding tendencies Endocrine: Patient denies heat/cold intolerance  GU: As per HPI.  OBJECTIVE Vitals:   09/27/23 1020  BP: 125/70  Pulse: 69   There is no height or weight on file to calculate BMI.  Physical Examination Constitutional: No obvious distress; patient is non-toxic appearing  Cardiovascular: No visible lower extremity edema.  Respiratory: The patient does not have audible wheezing/stridor; respirations do not appear labored  Gastrointestinal: Abdomen non-distended Musculoskeletal: Normal ROM of UEs  Skin: No obvious rashes/open sores   Neurologic: CN 2-12 grossly intact Psychiatric: Answered questions appropriately with normal affect  Hematologic/Lymphatic/Immunologic: No obvious bruises or sites of spontaneous bleeding  UA: glucosuria, otherwise unremarkable PVR: 24 ml  ASSESSMENT Urinary frequency - Plan: BLADDER SCAN AMB NON-IMAGING, Urinalysis, Routine w reflex microscopic, Vibegron  (GEMTESA ) 75 MG TABS, mirabegron  ER (MYRBETRIQ ) 25 MG TB24 tablet  OAB (overactive bladder) - Plan: Vibegron  (GEMTESA ) 75 MG TABS, mirabegron  ER (MYRBETRIQ ) 25 MG TB24 tablet  Urinary urgency - Plan: Vibegron  (GEMTESA ) 75 MG TABS, mirabegron  ER (MYRBETRIQ ) 25 MG TB24 tablet  Nocturia - Plan: Vibegron  (GEMTESA ) 75 MG TABS, mirabegron  ER (MYRBETRIQ ) 25 MG TB24 tablet  Will trial Gemtesa  75 mg daily via samples provided today while sending in prescription for Myrbetriq  (Mirabegron ) 25 mg daily to the [place], where he may have better cost / coverage.   Not a safe candidate for anticholinergic medications due to risk for side effects based on patient's age and comorbidities.   Again discussed reducing caffeine intake.  We agreed to plan for follow up in 6-8 weeks or sooner if needed. Patient verbalized understanding of and agreement with current plan. All questions were answered.  PLAN Advised the following: 1. Avoid caffeine.  2. Gemtesa  75 mg daily for now while awaiting Myrbetriq  25 mg daily.  3. No follow-ups on file.  Orders Placed This Encounter  Procedures   Urinalysis, Routine w reflex microscopic   BLADDER SCAN AMB NON-IMAGING    It has been explained that the patient is to follow regularly with their PCP in addition to all other providers involved in their care and to follow instructions provided by these respective offices. Patient advised to contact urology clinic if any urologic-pertaining questions, concerns, new symptoms or problems arise in the interim period.  Patient Instructions  Overactive bladder (OAB) overview  for patients:  Symptoms may include: urinary urgency (gotta go feeling) urinary frequency (voiding >8 times per day) night time urination (nocturia) urge incontinence of urine (UUI)  While we do not know the exact etiology of OAB, several treatment options exist including:  Behavioral therapy: Reducing fluid intake Decreasing bladder stimulants (such as caffeine) and irritants (such as acidic food, spicy foods, alcohol) Urge suppression strategies Bladder retraining via timed voiding  Pelvic floor physical therapy  Medication(s) - can  use one or both of the drug classes below. Anticholinergic / antimuscarinic medications:  Mechanism of action: Activate M3 receptors to reduce detrusor stimulation and increase bladder capacity   (parasympathetic nervous system). Effect: Relaxes the bladder to decrease overactivity, increase bladder storage capacity, and increase time between voids. Onset: Slow acting (may take 8-12 weeks to determine efficacy). Medications include: Vesicare (Solifenacin), Ditropan (Oxybutynin), Detrol (Tolterodine), Toviaz (Fesoterodine), Sanctura (Trospium), Urispas (Flavoxate), Enablex (Darifenacin), Bentyl (Dicyclomine), Levsin  (Hyoscyamine  ). Potential side effects include but are not limited to: Dry eyes, dry mouth, constipation, cognitive impairment, dementia risk with long term use, and urinary retention/ incomplete bladder emptying. Insurance companies generally prefer for patients to try 1-2 anticholinergic / antimuscarinic medications first due to low cost. Some exceptions are made based on patient-specific comorbidities / risk factors. Beta-3 agonist medications: Mechanism of action: Stimulates selective B3 adrenergic receptors to cause smooth muscle bladder relaxation (sympathetic nervous system). Effect: Relaxes the bladder to decrease overactivity, increase bladder storage capacity, and increase time between voids. Onset: Slow acting (may take 8-12 weeks  to determine efficacy). Medications include: Myrbetriq  (Mirabegron ) and Vibegron  (Gemtesa ). Potential side effects include but are not limited to: urinary retention / incomplete bladder emptying and elevated blood pressure (more likely to occur in individuals with pre-existing uncontrolled hypertension). These medications tend to be more expensive than the anticholinergic / antimuscarinic medications.   For patients with refractory OAB (if the above treatment options have been unsuccessful): Posterior tibial nerve stimulation (PTNS). Small acupuncture-type needle inserted near ankle with electric current to stimulate bladder via posterior tibial nerve pathway. Initially requires 12 weekly in-office treatments lasting 30 minutes each; followed by monthly in-office treatments lasting 30 minutes each for 1 year.  Bladder Botox injections. How it is done: Typically done via in-office cystoscopy; sometimes done in the OR depending on the situation. The bladder is numbed with lidocaine  instilled via a catheter. Then the urologist injects Botox into the bladder muscle wall in about 20 locations. Causes local paralysis of the bladder muscle at the injection sites to reduce bladder muscle overactivity / spasms. The effect lasts for approximately 6 months and cannot be reversed once performed. Risks may included but are not limited to: infection, incomplete bladder emptying/ urinary retention, short term need for self-catheterization or indwelling catheter, and need for repeat therapy. There is a 5-12% chance of needing to catheterize with Botox - that usually resolves in a few months as the Botox wears off. Typically Botox injections would need to be repeated every 3-12 months since this is not a permanent therapy.  Sacral neuromodulation trial (Medtronic lnterStim or Axonics implant). Sacral neuromodulation is FDA-approved for uncontrolled urinary urgency, urinary frequency, urinary urge incontinence,  non-obstructive urinary retention, or fecal incontinence. It is not FDA-approved as a treatment for pain. The goal of this therapy is at least a 50% improvement in symptoms. It is NOT realistic to expect a 100% cure. This is a a 2-step outpatient procedure. After a successful test period, a permanent wire and generator are placed in the OR. We discussed the risk of infection. We reviewed the fact that about 30% of patients fail the test phase and are not candidates for permanent generator placement. During the 1-2 week trial phase, symptoms are documented by the patient to determine response. If patient gets at least a 50% improvement in symptoms, they may then proceed with Step 2. Step 1: Trial lead placement. Per physician discretion, may done one of two ways: Percutaneous nerve evaluation (PNE) in the [facility]  urology office. Performed by urologist under local anesthesia (numbing the area with lidocaine ) using a spinal needle for placement of test wire, which usually stays in place for 5-7 days to determine therapy response. Test lead placement in OR under anesthesia. Usually stays in place 2 weeks to determine therapy response. > Step 2: Permanent implantation of sacral neuromodulation device, which is performed in the OR.  Sacral neuromodulation implants: All are conditionally MRI safe. Manufacturer: Medtronic Website: [employer].gl therapy/right-for-you.html Options: lnterStim X: Non-rechargeable. The battery lasts 10 years on average. lnterStim Micro: Rechargeable. The battery lasts 15 years on average and must be charged routinely. Approximately 50% smaller implant than lnterStim X implant.  Manufacturer: Axonics Website: Findrealrelief.axonics.com Options: Non-rechargeable (Axonics F15): The battery lasts 15 years on average. Rechargeable (Axonics R20): The battery lasts 20 years on average and must be  charged in office for about 1 hour every 6-10 months on average. Approximately 50% smaller implant than Axonics non-rechargeable implant.  Note: Generally the rechargeable devices are only advised for very small or thin patients who may not have sufficient adipose tissue to comfortably overlay the implanted device.  Suprapubic catheter (SP tube) placement. Only done in severely refractory OAB when all other options have failed or are not a viable treatment choice depending on patient factors. Involves placement of a catheter through the lower abdomen into the bladder to continuously drain the bladder into an external collection bag, which patient can then empty at their convenience every few hours. Done via an outpatient surgical procedure in the OR under anesthesia. Risks may included but are not limited to: surgical site pain, infections, skin irritation / breakdown, chronic bacteriuria, symptomatic UTls. The SP tube must stay in place continuously. This is a reversible procedure however - the insertion site will close if catheter is removed for more than a few hours. The SP tube must be exchanged routinely every 4 weeks to prevent the catheter from becoming clogged with sediment. SP tube exchanges are typically performed at a urology nurse visit or by a home health nurse.   Electronically signed by:  Lauraine JAYSON Oz, FNP   09/27/23    12:07 PM

## 2023-09-28 ENCOUNTER — Telehealth: Payer: Self-pay

## 2023-09-28 NOTE — Telephone Encounter (Signed)
 Copied from CRM [id number]. Topic: Appointments - Appointment Cancel/Reschedule >> Sep 28, 2023  1:53 PM Tiffany S wrote: Patient/patient representative is calling to cancel or reschedule an appointment. Refer to attachments for appointment information.  Patient is asking if he can do his labs before his visit tomorrow no orders are in patient chart please follow up with patient

## 2023-09-30 ENCOUNTER — Other Ambulatory Visit: Payer: Self-pay | Admitting: Family Medicine

## 2023-09-30 DIAGNOSIS — E119 Type 2 diabetes mellitus without complications: Secondary | ICD-10-CM

## 2023-09-30 DIAGNOSIS — E538 Deficiency of other specified B group vitamins: Secondary | ICD-10-CM

## 2023-09-30 DIAGNOSIS — E559 Vitamin D deficiency, unspecified: Secondary | ICD-10-CM

## 2023-09-30 DIAGNOSIS — E782 Mixed hyperlipidemia: Secondary | ICD-10-CM

## 2023-09-30 LAB — T4, FREE: Free T4: 1.28 ng/dL (ref 0.82–1.77)

## 2023-09-30 LAB — TSH: TSH: 2.01 u[IU]/mL (ref 0.450–4.500)

## 2023-09-30 NOTE — Telephone Encounter (Signed)
 Patient aware.

## 2023-09-30 NOTE — Telephone Encounter (Signed)
 Orders in

## 2023-09-30 NOTE — Telephone Encounter (Signed)
 Orders are now in and pt can be advised that he can get these before his appt.

## 2023-10-01 ENCOUNTER — Ambulatory Visit: Admitting: Family Medicine

## 2023-10-01 ENCOUNTER — Encounter: Payer: Self-pay | Admitting: Family Medicine

## 2023-10-01 VITALS — BP 135/77 | HR 61 | Ht 70.0 in | Wt 225.0 lb

## 2023-10-01 DIAGNOSIS — E119 Type 2 diabetes mellitus without complications: Secondary | ICD-10-CM | POA: Diagnosis not present

## 2023-10-01 DIAGNOSIS — Z7984 Long term (current) use of oral hypoglycemic drugs: Secondary | ICD-10-CM

## 2023-10-01 DIAGNOSIS — I10 Essential (primary) hypertension: Secondary | ICD-10-CM | POA: Diagnosis not present

## 2023-10-01 NOTE — Patient Instructions (Signed)

## 2023-10-01 NOTE — Assessment & Plan Note (Signed)
 Vitals:   10/01/23 0839  BP: 135/77   Continue amlodipine  5 mg, Lisinopril  10 mg , Metoprolol  50 mg daily  Labs ordered  Continued discussion on DASH diet, low sodium diet and maintain a exercise routine for 150 minutes per week.

## 2023-10-01 NOTE — Assessment & Plan Note (Signed)
 Last Hemoglobin A1c: 7.6 Labs: Ordered today, results pending; will follow up accordingly. The patient reports adhering to prescribed medications: Empagliflozin -metFORMIN  HCl 12.07-998 mg once daily and glipizide  5 mg twice daily   Reviewed non-pharmacological interventions, including a balanced diet rich in lean proteins, healthy fats, whole grains, and high-fiber vegetables. Emphasized reducing refined sugars and processed carbohydrates, and incorporating more fruits, leafy greens, and legumes. Education: Patient was educated on recognizing signs and symptoms of both hypoglycemia and hyperglycemia, and advised to seek emergency care if these symptoms occur. Follow-Up: Scheduled for follow-up in 3-4 months, or sooner if needed. Patient Understanding: The patient verbalized understanding of the care plan, and all questions were answered. Additional Care: Ophthalmology current Foot exam results were within normal limits.

## 2023-10-01 NOTE — Progress Notes (Signed)
 Established Patient Office Visit   Subjective  Patient ID: Bradley Acevedo, male    DOB: 08-05-1945  Age: 78 y.o. MRN: [id number]  Chief Complaint  Patient presents with   Care Management    Four month follow up    He  has a past medical history of Acid reflux, CAD (coronary artery disease), Diverticulosis, Hypertension, Hypothyroid, IBS (irritable bowel syndrome), and Type 2 diabetes mellitus (HCC).  HPI Patient presents to the clinic for chronic follow up. For the details of today's visit, please refer to assessment and plan.   Review of Systems  Constitutional:  Negative for chills and fever.  Eyes:  Negative for blurred vision.  Respiratory:  Negative for shortness of breath.   Cardiovascular:  Negative for chest pain.  Genitourinary:  Negative for dysuria.  Neurological:  Negative for dizziness.      Objective:     BP 135/77   Pulse 61   Ht 5' 10 (1.778 m)   Wt 225 lb (102.1 kg)   SpO2 95%   BMI 32.28 kg/m  BP Readings from Last 3 Encounters:  10/01/23 135/77  09/27/23 125/70  09/15/23 (!) 116/56      Physical Exam Vitals reviewed.  Constitutional:      General: He is not in acute distress.    Appearance: Normal appearance. He is not ill-appearing, toxic-appearing or diaphoretic.  HENT:     Head: Normocephalic.  Eyes:     General:        Right eye: No discharge.        Left eye: No discharge.     Conjunctiva/sclera: Conjunctivae normal.  Cardiovascular:     Rate and Rhythm: Normal rate.     Pulses: Normal pulses.     Heart sounds: Normal heart sounds.  Pulmonary:     Effort: Pulmonary effort is normal. No respiratory distress.     Breath sounds: Normal breath sounds.  Abdominal:     General: Bowel sounds are normal.     Palpations: Abdomen is soft.     Tenderness: There is no abdominal tenderness. There is no right CVA tenderness, left CVA tenderness or guarding.  Skin:    General: Skin is warm and dry.     Capillary Refill: Capillary refill  takes less than 2 seconds.  Neurological:     Mental Status: He is alert.  Psychiatric:        Mood and Affect: Mood normal.        Behavior: Behavior normal.      No results found for any visits on 10/01/23.  The ASCVD Risk score (Arnett DK, et al., 2019) failed to calculate for the following reasons:   The valid total cholesterol range is 130 to 320 mg/dL    Assessment & Plan:  Essential hypertension Assessment & Plan: Vitals:   10/01/23 0839  BP: 135/77   Continue amlodipine  5 mg, Lisinopril  10 mg , Metoprolol  50 mg daily  Labs ordered  Continued discussion on DASH diet, low sodium diet and maintain a exercise routine for 150 minutes per week.    Type 2 diabetes mellitus without complication, without long-term current use of insulin (HCC) Assessment & Plan: Last Hemoglobin A1c: 7.6 Labs: Ordered today, results pending; will follow up accordingly. The patient reports adhering to prescribed medications: Empagliflozin -metFORMIN  HCl 12.07-998 mg once daily and glipizide  5 mg twice daily   Reviewed non-pharmacological interventions, including a balanced diet rich in lean proteins, healthy fats, whole grains, and high-fiber vegetables.  Emphasized reducing refined sugars and processed carbohydrates, and incorporating more fruits, leafy greens, and legumes. Education: Patient was educated on recognizing signs and symptoms of both hypoglycemia and hyperglycemia, and advised to seek emergency care if these symptoms occur. Follow-Up: Scheduled for follow-up in 3-4 months, or sooner if needed. Patient Understanding: The patient verbalized understanding of the care plan, and all questions were answered. Additional Care: Ophthalmology current Foot exam results were within normal limits.     Orders: -     Microalbumin / creatinine urine ratio    Return in about 4 months (around 02/01/2024), or if symptoms worsen or fail to improve, for hypertension, hyperlipidemia, type 2 diabetes.    Hilario Kidd Wilhelmena Falter, FNP

## 2023-10-03 LAB — LIPID PANEL
Chol/HDL Ratio: 2.2 ratio (ref 0.0–5.0)
Cholesterol, Total: 78 mg/dL — ABNORMAL LOW (ref 100–199)
HDL: 35 mg/dL — ABNORMAL LOW (ref >39)
LDL Chol Calc (NIH): 24 mg/dL (ref 0–99)
Triglycerides: 96 mg/dL (ref 0–149)
VLDL Cholesterol Cal: 19 mg/dL (ref 5–40)

## 2023-10-03 LAB — CBC WITH DIFFERENTIAL/PLATELET
Basophils Absolute: 0.1 x10E3/uL (ref 0.0–0.2)
Basos: 2 %
EOS (ABSOLUTE): 0.2 x10E3/uL (ref 0.0–0.4)
Eos: 3 %
Hematocrit: 43.7 % (ref 37.5–51.0)
Hemoglobin: 13.9 g/dL (ref 13.0–17.7)
Immature Grans (Abs): 0 x10E3/uL (ref 0.0–0.1)
Immature Granulocytes: 0 %
Lymphocytes Absolute: 1.4 x10E3/uL (ref 0.7–3.1)
Lymphs: 23 %
MCH: 30.1 pg (ref 26.6–33.0)
MCHC: 31.8 g/dL (ref 31.5–35.7)
MCV: 95 fL (ref 79–97)
Monocytes Absolute: 0.4 x10E3/uL (ref 0.1–0.9)
Monocytes: 6 %
Neutrophils Absolute: 4.1 x10E3/uL (ref 1.4–7.0)
Neutrophils: 66 %
Platelets: 179 x10E3/uL (ref 150–450)
RBC: 4.62 x10E6/uL (ref 4.14–5.80)
RDW: 13.6 % (ref 11.6–15.4)
WBC: 6.2 x10E3/uL (ref 3.4–10.8)

## 2023-10-03 LAB — MICROALBUMIN / CREATININE URINE RATIO
Creatinine, Urine: 24.2 mg/dL
Microalb/Creat Ratio: <12 mg/g{creat} (ref 0–29)
Microalbumin, Urine: <3 ug/mL

## 2023-10-03 LAB — BMP8+EGFR
BUN/Creatinine Ratio: 18 (ref 10–24)
BUN: 19 mg/dL (ref 8–27)
CO2: 21 mmol/L (ref 20–29)
Calcium: 10.1 mg/dL (ref 8.6–10.2)
Chloride: 103 mmol/L (ref 96–106)
Creatinine, Ser: 1.05 mg/dL (ref 0.76–1.27)
Glucose: 121 mg/dL — ABNORMAL HIGH (ref 70–99)
Potassium: 5.1 mmol/L (ref 3.5–5.2)
Sodium: 138 mmol/L (ref 134–144)
eGFR: 74 mL/min/1.73 (ref >59)

## 2023-10-03 LAB — VITAMIN B12: Vitamin B-12: 383 pg/mL (ref 232–1245)

## 2023-10-03 LAB — VITAMIN D 25 HYDROXY (VIT D DEFICIENCY, FRACTURES): Vit D, 25-Hydroxy: 44.5 ng/mL (ref 30.0–100.0)

## 2023-10-03 LAB — HEMOGLOBIN A1C
Est. average glucose Bld gHb Est-mCnc: 151 mg/dL
Hgb A1c MFr Bld: 6.9 % — ABNORMAL HIGH (ref 4.8–5.6)

## 2023-10-06 ENCOUNTER — Encounter: Payer: Self-pay | Admitting: "Endocrinology

## 2023-10-06 ENCOUNTER — Ambulatory Visit: Payer: Self-pay | Admitting: Family Medicine

## 2023-10-06 ENCOUNTER — Ambulatory Visit (INDEPENDENT_AMBULATORY_CARE_PROVIDER_SITE_OTHER): Payer: Medicare Other | Admitting: "Endocrinology

## 2023-10-06 VITALS — BP 110/62 | HR 52 | Ht 70.0 in | Wt 226.6 lb

## 2023-10-06 DIAGNOSIS — E063 Autoimmune thyroiditis: Secondary | ICD-10-CM | POA: Insufficient documentation

## 2023-10-06 DIAGNOSIS — Z7984 Long term (current) use of oral hypoglycemic drugs: Secondary | ICD-10-CM

## 2023-10-06 DIAGNOSIS — E119 Type 2 diabetes mellitus without complications: Secondary | ICD-10-CM | POA: Diagnosis not present

## 2023-10-06 DIAGNOSIS — E782 Mixed hyperlipidemia: Secondary | ICD-10-CM

## 2023-10-06 MED ORDER — LEVOTHYROXINE SODIUM 75 MCG PO TABS: 75.0000 ug | ORAL_TABLET | Freq: Every day | ORAL | 1 refills | Status: DC

## 2023-10-06 NOTE — Progress Notes (Signed)
 Endocrinology follow-up note                                             10/06/2023, 12:18 PM   Subjective:    Patient ID: Bradley Acevedo, male    DOB: 1945/11/30, PCP Del Wilhelmena Lloyd Sola, FNP   Past Medical History:  Diagnosis Date   Acid reflux    CAD (coronary artery disease)    Moderate LAD disease May 2022 managed medically   Diverticulosis    Hypertension    Hypothyroid    IBS (irritable bowel syndrome)    Type 2 diabetes mellitus [facility])    Past Surgical History:  Procedure Laterality Date   CIRCUMCISION     COLONOSCOPY     COLONOSCOPY  06/25/2016   [place], [place] by Dr. Geary   CORONARY PRESSURE/FFR STUDY N/A 07/25/2020   Procedure: INTRAVASCULAR PRESSURE WIRE/FFR STUDY;  Surgeon: Court Dorn PARAS, MD;  Location: [facility];  Service: Cardiovascular;  Laterality: N/A;   LEFT HEART CATH AND CORONARY ANGIOGRAPHY N/A 07/25/2020   Procedure: LEFT HEART CATH AND CORONARY ANGIOGRAPHY;  Surgeon: Court Dorn PARAS, MD;  Location: [facility];  Service: Cardiovascular;  Laterality: N/A;   meniscus left knee     MENISCUS REPAIR Left 05/28/2017   [place], [place] by Dr. Merilee   TONSILLECTOMY  1953   [place], [place]   Social History   Socioeconomic History   Marital status: Married    Spouse name: Not on file   Number of children: Not on file   Years of education: Not on file   Highest education level: Associate degree: occupational, Scientist, product/process development, or vocational program  Occupational History   Not on file  Tobacco Use   Smoking status: Former    Current packs/day: 0.00    Types: Cigarettes    Start date: 36    Quit date: 01/26/2006    Years since quitting: 17.7   Smokeless tobacco: Never  Vaping Use   Vaping status: Never Used  Substance and Sexual Activity   Alcohol use: Not Currently    Comment: rarely   Drug use: Never   Sexual activity: Not Currently    Birth control/protection: Abstinence    Comment: ED  Other Topics Concern   Not  on file  Social History Narrative   Not on file   Social Drivers of Health   Financial Resource Strain: Low Risk  (08/10/2023)   Overall Financial Resource Strain (CARDIA)    Difficulty of Paying Living Expenses: Not hard at all  Food Insecurity: No Food Insecurity (09/29/2023)   Hunger Vital Sign    Worried About Running Out of Food in the Last Year: Never true    Ran Out of Food in the Last Year: Never true  Transportation Needs: No Transportation Needs (09/29/2023)   PRAPARE - Administrator, Civil Service (Medical): No    Lack of Transportation (Non-Medical): No  Physical Activity: Insufficiently Active (09/29/2023)   Exercise Vital Sign    Days of Exercise per Week: 3 days    Minutes of Exercise per Session: 40 min  Stress: No Stress Concern Present (09/29/2023)   [employer] of Occupational Health - Occupational Stress Questionnaire    Feeling of Stress: Not at all  Recent Concern: Stress - Stress Concern Present (08/10/2023)   [employer] of Occupational  Health - Occupational Stress Questionnaire    Feeling of Stress : To some extent  Social Connections: Socially Integrated (09/29/2023)   Social Connection and Isolation Panel    Frequency of Communication with Friends and Family: More than three times a week    Frequency of Social Gatherings with Friends and Family: More than three times a week    Attends Religious Services: More than 4 times per year    Active Member of [employer] or Organizations: Yes    Attends Engineer, structural: More than 4 times per year    Marital Status: Married   Family History  Problem Relation Age of Onset   Cancer Mother    Hypertension Mother    Hyperlipidemia Mother    Hyperlipidemia Father    Hypertension Father    Arthritis Father    Outpatient Encounter Medications as of 10/06/2023  Medication Sig   amLODipine  (NORVASC ) 5 MG tablet TAKE 1/2 TABLET BY MOUTH ONCE DAILY   aspirin  EC 81 MG EC tablet Take 1 tablet  (81 mg total) by mouth daily. Swallow whole.   Cholecalciferol (D3 DOTS) 50 MCG (2000 UT) TBDP Take 2,000 Units by mouth in the morning.   Empagliflozin -metFORMIN  HCl 12.07-998 MG TABS Take 1 tablet by mouth daily.   glipiZIDE  (GLUCOTROL ) 5 MG tablet Take 1 tablet (5 mg total) by mouth 2 (two) times daily before a meal.   isosorbide  mononitrate (IMDUR ) 60 MG 24 hr tablet TAKE 1 TABLET IN THE       MORNING AND AT BEDTIME   levothyroxine  (SYNTHROID ) 75 MCG tablet Take 1 tablet (75 mcg total) by mouth daily before breakfast.   lisinopril  (ZESTRIL ) 10 MG tablet TAKE 1 TABLET EVERY MORNING   metoprolol  succinate (TOPROL -XL) 50 MG 24 hr tablet TAKE 1 TABLET DAILY, WITH  OR IMMEDIATELY FOLLOWING A MEAL   mirabegron  ER (MYRBETRIQ ) 25 MG TB24 tablet Take 1 tablet (25 mg total) by mouth daily.   nitroGLYCERIN  (NITROSTAT ) 0.4 MG SL tablet Place 1 tablet (0.4 mg total) under the tongue every 5 (five) minutes x 3 doses as needed for chest pain.   Omega-3 Fatty Acids (FISH OIL) 1000 MG CAPS Take 1 capsule by mouth daily.   ondansetron  (ZOFRAN -ODT) 4 MG disintegrating tablet Take 4 mg by mouth every 8 (eight) hours as needed for nausea or vomiting.   pantoprazole  (PROTONIX ) 40 MG tablet Take 40 mg by mouth daily.   Probiotic Product (PROBIOTIC PO) Take 1 capsule by mouth in the morning.   rosuvastatin  (CRESTOR ) 40 MG tablet Take 1 tablet (40 mg total) by mouth daily.   Vibegron  (GEMTESA ) 75 MG TABS Take 1 tablet (75 mg total) by mouth daily.   [DISCONTINUED] levothyroxine  (SYNTHROID ) 75 MCG tablet TAKE 1 TABLET DAILY BEFORE BREAKFAST   No facility-administered encounter medications on file as of 10/06/2023.   ALLERGIES: No Known Allergies  VACCINATION STATUS: Immunization History  Administered Date(s) Administered   Fluad Trivalent(High Dose 65+) 12/22/2022   Influenza Split 01/07/2017   Influenza-Unspecified 12/26/2019, 12/02/2020   Moderna Covid-19 Vaccine Bivalent Booster 79yrs & up 12/30/2020    Moderna Sars-Covid-2 Vaccination 04/20/2019, 05/18/2019, 01/11/2020, 05/16/2020   Pneumococcal Conjugate-13 07/13/2016   Pneumococcal Polysaccharide-23 12/22/2018   Td (Adult),5 Lf Tetanus Toxid, Preservative Free 02/16/2023   Tdap 02/07/2011, 02/17/2021   Zoster Recombinant(Shingrix) 04/20/2019, 05/18/2019, 07/03/2019, 09/04/2019    HPI her Bradley Acevedo is 78 y.o. male who presents today with a medical history as above. he is being seen in consultation  for hypothyroidism requested by Del Wilhelmena Lloyd Sola, FNP.   History is obtained directly from the patient and chart review.  He reports that he was diagnosed with hypothyroidism at approximate age of 23.  He was given levothyroxine  as thyroid  hormone replacement.  His current levothyroxine  dose is 75 mcg p.o. daily before breakfast.  Has no new complaints.      He denies palpitations, tremors, nor heat intolerance.  He denies dysphagia, shortness of breath nor voice change. He denies any family history of thyroid  dysfunction or thyroid  malignancy. His other medical problems include type 2 diabetes on metformin , hyperlipidemia on Crestor  and hypertension on amlodipine , metoprolol , lisinopril . He is currently on empagliflozin - metformin  combination as well as glipizide .  His recent A1c was 6.9% improving.  Review of Systems  Constitutional: no recent weight gain/loss, no fatigue, no subjective hyperthermia, no subjective hypothermia    Objective:       10/06/2023    8:44 AM 10/01/2023    8:39 AM 09/27/2023   10:20 AM  Vitals with BMI  Height 5' 10 5' 10   Weight 226 lbs 10 oz 225 lbs   BMI 32.51 32.28   Systolic 110 135 874  Diastolic 62 77 70  Pulse 52 61 69    BP 110/62   Pulse (!) 52   Ht 5' 10 (1.778 m)   Wt 226 lb 9.6 oz (102.8 kg)   BMI 32.51 kg/m   Wt Readings from Last 3 Encounters:  10/06/23 226 lb 9.6 oz (102.8 kg)  10/01/23 225 lb (102.1 kg)  09/15/23 227 lb (103 kg)    Physical  Exam  Constitutional:  Body mass index is 32.51 kg/m.,  not in acute distress, normal state of mind Eyes: PERRLA, EOMI, no exophthalmos ENT: moist mucous membranes, no gross thyromegaly, no gross cervical lymphadenopathy   CMP ( most recent) CMP     Component Value Date/Time   NA 138 10/01/2023 0933   K 5.1 10/01/2023 0933   CL 103 10/01/2023 0933   CO2 21 10/01/2023 0933   GLUCOSE 121 (H) 10/01/2023 0933   GLUCOSE 204 (H) 07/12/2022 1025   BUN 19 10/01/2023 0933   CREATININE 1.05 10/01/2023 0933   CALCIUM  10.1 10/01/2023 0933   PROT 6.4 06/19/2022 1141   ALBUMIN 4.4 06/19/2022 1141   AST 24 06/19/2022 1141   ALT 29 06/19/2022 1141   ALKPHOS 63 06/19/2022 1141   BILITOT 0.3 06/19/2022 1141   GFRNONAA >60 07/12/2022 1025     Diabetic Labs (most recent): Lab Results  Component Value Date   HGBA1C 6.9 (H) 10/01/2023   HGBA1C 7.6 (H) 05/26/2023   HGBA1C 7.5 (H) 12/22/2022     Lipid Panel ( most recent) Lipid Panel     Component Value Date/Time   CHOL 78 (L) 10/01/2023 0933   TRIG 96 10/01/2023 0933   HDL 35 (L) 10/01/2023 0933   CHOLHDL 2.2 10/01/2023 0933   CHOLHDL 3.4 06/28/2020 1035   VLDL 20 06/28/2020 1035   LDLCALC 24 10/01/2023 0933   LABVLDL 19 10/01/2023 0933      Lab Results  Component Value Date   TSH 2.010 09/29/2023   TSH 4.080 03/30/2023   TSH 1.860 09/21/2022   TSH 5.440 (H) 03/19/2022   FREET4 1.28 09/29/2023   FREET4 1.09 03/30/2023   FREET4 1.28 09/21/2022   FREET4 1.13 03/19/2022         Assessment & Plan:   1. Hypothyroidism 2.  Hashimoto's thyroiditis 3.  Hyperlipidemia 4.  Type 2 diabetes  - I have reviewed his new and available thyroid  records and clinically evaluated the patient. - Based on these reviews, he has hypothyroidism due to Hashimoto's thyroiditis with evidence of appropriate replacement. -His previsit labs are consistent with appropriate replacement.  He is advised to continue levothyroxine  75 mcg p.o. daily  before breakfast.    - We discussed about the correct intake of his thyroid  hormone, on empty stomach at fasting, with water, separated by at least 30 minutes from breakfast and other medications,  and separated by more than 4 hours from calcium , iron, multivitamins, acid reflux medications (PPIs). -Patient is made aware of the fact that thyroid  hormone replacement is needed for life, dose to be adjusted by periodic monitoring of thyroid  function tests.  In the absence of clinical goiter, he will not need thyroid  imaging at this time. This patient has a chance to come off of the glipizide  while maintaining empagliflozin -metformin  combination in light of his recent A1c of 6.9%. He is advised to maintain close follow-up with his PMD for management of diabetes, hypertension and hyperlipidemia.  He is advised to continue Crestor  20 mg p.o. nightly.  Side effects and precautions discussed with him.    - he is advised to maintain close follow up with Del Wilhelmena Lloyd Sola, FNP for primary care needs.   I spent  22  minutes in the care of the patient today including review of labs from Thyroid  Function, CMP, and other relevant labs ; imaging/biopsy records (current and previous including abstractions from other facilities); face-to-face time discussing  his lab results and symptoms, medications doses, his options of short and long term treatment based on the latest standards of care / guidelines;   and documenting the encounter.  Bradley Acevedo  participated in the discussions, expressed understanding, and voiced agreement with the above plans.  All questions were answered to his satisfaction. he is encouraged to contact clinic should he have any questions or concerns prior to his return visit.   Follow up plan: Return in about 4 months (around 02/06/2024) for F/U with Pre-visit Labs.   Ranny Earl, MD [facility] [address] [place], [place] [address] Phone: [phone]  Fax: [phone]     10/06/2023, 12:18 PM  This note was partially dictated with voice recognition software. Similar sounding words can be transcribed inadequately or may not  be corrected upon review.

## 2023-10-08 ENCOUNTER — Telehealth: Payer: Self-pay

## 2023-10-08 NOTE — Telephone Encounter (Signed)
 Copied from CRM [id number]. Topic: Clinical - Medication Question >> Oct 08, 2023  2:43 PM Tobias CROME wrote: Reason for CRM: KP with [facility] calling for dosage clarification for patient's rosuvastatin .   Best callback number: [phone] Option 2 Reference number: [id number]

## 2023-10-11 NOTE — Telephone Encounter (Signed)
Spoke to [facility].

## 2023-10-13 ENCOUNTER — Telehealth: Payer: Self-pay

## 2023-10-13 NOTE — Telephone Encounter (Signed)
Please disregard referral

## 2023-10-13 NOTE — Telephone Encounter (Signed)
 Pt states he brought you the form from the [place] that he already had his diabetic eye exam done

## 2023-10-13 NOTE — Telephone Encounter (Signed)
 Copied from CRM #[id number]. Topic: Referral - Status >> Oct 13, 2023 12:07 PM Tiffany S wrote: Reason for CRM: Patient is requesting call back from nurse wants to know why a referral was sent over to opthalmology

## 2023-10-14 NOTE — Telephone Encounter (Signed)
 Noted

## 2023-10-23 ENCOUNTER — Other Ambulatory Visit: Payer: Self-pay | Admitting: Cardiology

## 2023-11-07 ENCOUNTER — Other Ambulatory Visit: Payer: Self-pay | Admitting: Cardiology

## 2023-11-11 ENCOUNTER — Ambulatory Visit: Admitting: Urology

## 2023-11-15 ENCOUNTER — Other Ambulatory Visit: Payer: Self-pay | Admitting: Family Medicine

## 2024-01-10 ENCOUNTER — Ambulatory Visit: Admitting: Urology

## 2024-01-17 ENCOUNTER — Ambulatory Visit (INDEPENDENT_AMBULATORY_CARE_PROVIDER_SITE_OTHER)

## 2024-01-17 DIAGNOSIS — Z23 Encounter for immunization: Secondary | ICD-10-CM | POA: Diagnosis not present

## 2024-01-17 NOTE — Progress Notes (Signed)
 Patient is in office today for a nurse visit for Immunization. Patient Injection was given in the  Right deltoid. Patient tolerated injection well.

## 2024-01-28 LAB — TSH: TSH: 2.12 u[IU]/mL (ref 0.450–4.500)

## 2024-01-28 LAB — T4, FREE: Free T4: 1.11 ng/dL (ref 0.82–1.77)

## 2024-02-01 ENCOUNTER — Ambulatory Visit

## 2024-02-09 ENCOUNTER — Ambulatory Visit (INDEPENDENT_AMBULATORY_CARE_PROVIDER_SITE_OTHER): Admitting: "Endocrinology

## 2024-02-09 ENCOUNTER — Encounter: Payer: Self-pay | Admitting: "Endocrinology

## 2024-02-09 VITALS — BP 138/72 | HR 72 | Ht 70.0 in | Wt 231.6 lb

## 2024-02-09 DIAGNOSIS — E063 Autoimmune thyroiditis: Secondary | ICD-10-CM | POA: Diagnosis not present

## 2024-02-09 DIAGNOSIS — E782 Mixed hyperlipidemia: Secondary | ICD-10-CM

## 2024-02-09 NOTE — Progress Notes (Signed)
 Endocrinology follow-up note                                             02/09/2024, 11:09 AM   Subjective:    Patient ID: Bradley Acevedo, male    DOB: 14-Nov-1945, PCP Del Wilhelmena Lloyd Sola, FNP   Past Medical History:  Diagnosis Date   Acid reflux    CAD (coronary artery disease)    Moderate LAD disease May 2022 managed medically   Diverticulosis    Hypertension    Hypothyroid    IBS (irritable bowel syndrome)    Type 2 diabetes mellitus [facility])    Past Surgical History:  Procedure Laterality Date   CIRCUMCISION     COLONOSCOPY     COLONOSCOPY  06/25/2016   [place], [place] by Dr. Geary   CORONARY PRESSURE/FFR STUDY N/A 07/25/2020   Procedure: INTRAVASCULAR PRESSURE WIRE/FFR STUDY;  Surgeon: Court Dorn PARAS, MD;  Location: [facility] IN[facility]AS[facility];  Service: Cardiovascular;  Laterality: N/A;   LEFT HEART CATH AND CORONARY ANGIOGRAPHY N/A 07/25/2020   Procedure: LEFT HEART CATH AND CORONARY ANGIOGRAPHY;  Surgeon: Court Dorn PARAS, MD;  Location: [facility] INVAS[facility] CV [facility];  Service: Cardiovascular;  Laterality: N/A;   meniscus left knee     MENISCUS REPAIR Left 05/28/2017   [place], [place] by Dr. Merilee   TONSILLECTOMY  1953   [place], [place]   Social History   Socioeconomic History   Marital status: Married    Spouse name: Not on file   Number of children: Not on file   Years of education: Not on file   Highest education level: Associate degree: occupational, scientist, product/process development, or vocational program  Occupational History   Not on file  Tobacco Use   Smoking status: Former    Current packs/day: 0.00    Types: Cigarettes    Start date: 54    Quit date: 01/26/2006    Years since quitting: 18.0   Smokeless tobacco: Never  Vaping Use   Vaping status: Never Used  Substance and Sexual Activity   Alcohol use: Not Currently    Comment: rarely   Drug use: Never   Sexual activity: Not Currently    Birth control/protection: Abstinence    Comment: ED  Other Topics Concern   Not  on file  Social History Narrative   Not on file   Social Drivers of Health   Financial Resource Strain: Low Risk  (08/10/2023)   Overall Financial Resource Strain (CARDIA)    Difficulty of Paying Living Expenses: Not hard at all  Food Insecurity: No Food Insecurity (09/29/2023)   Hunger Vital Sign    Worried About Running Out of Food in the Last Year: Never true    Ran Out of Food in the Last Year: Never true  Transportation Needs: No Transportation Needs (09/29/2023)   PRAPARE - Administrator, Civil Service (Medical): No    Lack of Transportation (Non-Medical): No  Physical Activity: Insufficiently Active (09/29/2023)   Exercise Vital Sign    Days of Exercise per Week: 3 days    Minutes of Exercise per Session: 40 min  Stress: No Stress Concern Present (09/29/2023)   [employer] of Occupational Health - Occupational Stress Questionnaire    Feeling of Stress: Not at all  Recent Concern: Stress - Stress Concern Present (08/10/2023)   [employer] of Occupational  Health - Occupational Stress Questionnaire    Feeling of Stress : To some extent  Social Connections: Socially Integrated (09/29/2023)   Social Connection and Isolation Panel    Frequency of Communication with Friends and Family: More than three times a week    Frequency of Social Gatherings with Friends and Family: More than three times a week    Attends Religious Services: More than 4 times per year    Active Member of [employer] or Organizations: Yes    Attends Engineer, Structural: More than 4 times per year    Marital Status: Married   Family History  Problem Relation Age of Onset   Cancer Mother    Hypertension Mother    Hyperlipidemia Mother    Hyperlipidemia Father    Hypertension Father    Arthritis Father    Outpatient Encounter Medications as of 02/09/2024  Medication Sig   amLODipine  (NORVASC ) 5 MG tablet TAKE 1/2 TABLET BY MOUTH ONCE DAILY   aspirin  EC 81 MG EC tablet Take 1 tablet  (81 mg total) by mouth daily. Swallow whole.   Cholecalciferol (D3 DOTS) 50 MCG (2000 UT) TBDP Take 2,000 Units by mouth in the morning.   Empagliflozin -metFORMIN  HCl 12.07-998 MG TABS Take 1 tablet by mouth daily.   glipiZIDE  (GLUCOTROL ) 5 MG tablet TAKE 1 TABLET TWICE A DAY  BEFORE MEALS   isosorbide  mononitrate (IMDUR ) 60 MG 24 hr tablet TAKE 1 TABLET IN THE       MORNING AND AT BEDTIME   levothyroxine  (SYNTHROID ) 75 MCG tablet Take 1 tablet (75 mcg total) by mouth daily before breakfast.   lisinopril  (ZESTRIL ) 10 MG tablet TAKE 1 TABLET EVERY MORNING   metoprolol  succinate (TOPROL -XL) 50 MG 24 hr tablet TAKE 1 TABLET DAILY, WITH  OR IMMEDIATELY FOLLOWING A MEAL   mirabegron  ER (MYRBETRIQ ) 25 MG TB24 tablet Take 1 tablet (25 mg total) by mouth daily.   nitroGLYCERIN  (NITROSTAT ) 0.4 MG SL tablet Place 1 tablet (0.4 mg total) under the tongue every 5 (five) minutes x 3 doses as needed for chest pain.   Omega-3 Fatty Acids (FISH OIL) 1000 MG CAPS Take 1 capsule by mouth daily.   ondansetron  (ZOFRAN -ODT) 4 MG disintegrating tablet Take 4 mg by mouth every 8 (eight) hours as needed for nausea or vomiting.   pantoprazole  (PROTONIX ) 40 MG tablet Take 40 mg by mouth daily.   Probiotic Product (PROBIOTIC PO) Take 1 capsule by mouth in the morning.   rosuvastatin  (CRESTOR ) 40 MG tablet Take 1 tablet (40 mg total) by mouth daily.   Vibegron  (GEMTESA ) 75 MG TABS Take 1 tablet (75 mg total) by mouth daily.   No facility-administered encounter medications on file as of 02/09/2024.   ALLERGIES: No Known Allergies  VACCINATION STATUS: Immunization History  Administered Date(s) Administered   Fluad Trivalent(High Dose 65+) 12/22/2022   INFLUENZA, HIGH DOSE SEASONAL PF 01/17/2024   Influenza Split 01/07/2017   Influenza-Unspecified 12/26/2019, 12/02/2020   Moderna Covid-19 Vaccine Bivalent Booster 44yrs & up 12/30/2020   Moderna Sars-Covid-2 Vaccination 04/20/2019, 05/18/2019, 01/11/2020, 05/16/2020    Pneumococcal Conjugate-13 07/13/2016   Pneumococcal Polysaccharide-23 12/22/2018   Td (Adult),5 Lf Tetanus Toxid, Preservative Free 02/16/2023   Tdap 02/07/2011, 02/17/2021   Zoster Recombinant(Shingrix) 04/20/2019, 05/18/2019, 07/03/2019, 09/04/2019    HPI her Bradley Acevedo is 78 y.o. male who presents today with a medical history as above. he is being seen in consultation for hypothyroidism requested by Del Wilhelmena Lloyd Sola, FNP.   History  is obtained directly from the patient and chart review.  He reports that he was diagnosed with hypothyroidism at approximate age of 82.  He was given levothyroxine  as thyroid  hormone replacement.  His current levothyroxine  dose is 75 mcg p.o. daily before breakfast.  He is tolerating his medication, has no new complaints.        He denies palpitations, tremors, nor heat intolerance.  He denies dysphagia, shortness of breath nor voice change. He denies any family history of thyroid  dysfunction or thyroid  malignancy. His other medical problems include type 2 diabetes on metformin , hyperlipidemia on Crestor  and hypertension on amlodipine , metoprolol , lisinopril . He is currently on empagliflozin - metformin  combination as well as glipizide .  His recent A1c was 6.9% improving.  Review of Systems  Constitutional: no recent weight gain/loss, no fatigue, no subjective hyperthermia, no subjective hypothermia    Objective:       02/09/2024    8:15 AM 10/06/2023    8:44 AM 10/01/2023    8:39 AM  Vitals with BMI  Height 5' 10 5' 10 5' 10  Weight 231 lbs 10 oz 226 lbs 10 oz 225 lbs  BMI 33.23 32.51 32.28  Systolic 138 110 864  Diastolic 72 62 77  Pulse 72 52 61    BP 138/72   Pulse 72   Ht 5' 10 (1.778 m)   Wt 231 lb 9.6 oz (105.1 kg)   BMI 33.23 kg/m   Wt Readings from Last 3 Encounters:  02/09/24 231 lb 9.6 oz (105.1 kg)  10/06/23 226 lb 9.6 oz (102.8 kg)  10/01/23 225 lb (102.1 kg)    Physical Exam  Constitutional:  Body mass index  is 33.23 kg/m.,  not in acute distress, normal state of mind Eyes: PERRLA, EOMI, no exophthalmos ENT: moist mucous membranes, no gross thyromegaly, no gross cervical lymphadenopathy   CMP ( most recent) CMP     Component Value Date/Time   NA 138 10/01/2023 0933   K 5.1 10/01/2023 0933   CL 103 10/01/2023 0933   CO2 21 10/01/2023 0933   GLUCOSE 121 (H) 10/01/2023 0933   GLUCOSE 204 (H) 07/12/2022 1025   BUN 19 10/01/2023 0933   CREATININE 1.05 10/01/2023 0933   CALCIUM  10.1 10/01/2023 0933   PROT 6.4 06/19/2022 1141   ALBUMIN 4.4 06/19/2022 1141   AST 24 06/19/2022 1141   ALT 29 06/19/2022 1141   ALKPHOS 63 06/19/2022 1141   BILITOT 0.3 06/19/2022 1141   GFRNONAA >60 07/12/2022 1025     Diabetic Labs (most recent): Lab Results  Component Value Date   HGBA1C 6.9 (H) 10/01/2023   HGBA1C 7.6 (H) 05/26/2023   HGBA1C 7.5 (H) 12/22/2022     Lipid Panel ( most recent) Lipid Panel     Component Value Date/Time   CHOL 78 (L) 10/01/2023 0933   TRIG 96 10/01/2023 0933   HDL 35 (L) 10/01/2023 0933   CHOLHDL 2.2 10/01/2023 0933   CHOLHDL 3.4 06/28/2020 1035   VLDL 20 06/28/2020 1035   LDLCALC 24 10/01/2023 0933   LABVLDL 19 10/01/2023 0933      Lab Results  Component Value Date   TSH 2.120 01/27/2024   TSH 2.010 09/29/2023   TSH 4.080 03/30/2023   TSH 1.860 09/21/2022   TSH 5.440 (H) 03/19/2022   FREET4 1.11 01/27/2024   FREET4 1.28 09/29/2023   FREET4 1.09 03/30/2023   FREET4 1.28 09/21/2022   FREET4 1.13 03/19/2022         Assessment &  Plan:   1. Hypothyroidism 2.  Hashimoto's thyroiditis 3.  Hyperlipidemia 4.  Type 2 diabetes  - I have reviewed his new and available thyroid  records and clinically evaluated the patient. - Based on these reviews, he has hypothyroidism due to Hashimoto's thyroiditis with evidence of appropriate replacement. -His previsit labs are consistent with appropriate replacement.  He is advised to continue levothyroxine  75 mcg  p.o. daily before breakfast.      - We discussed about the correct intake of his thyroid  hormone, on empty stomach at fasting, with water, separated by at least 30 minutes from breakfast and other medications,  and separated by more than 4 hours from calcium , iron, multivitamins, acid reflux medications (PPIs). -Patient is made aware of the fact that thyroid  hormone replacement is needed for life, dose to be adjusted by periodic monitoring of thyroid  function tests.   In the absence of clinical goiter, he will not need thyroid  imaging at this time. This patient has a chance to come off of the glipizide  while maintaining empagliflozin -metformin  combination in light of his recent A1c of 6.9%. He is advised to maintain close follow-up with his PMD for management of diabetes, hypertension and hyperlipidemia.  He is advised to continue Crestor  20 mg p.o. nightly.  Side effects and precautions discussed with him.     - he is advised to maintain close follow up with Del Wilhelmena Lloyd Sola, FNP for primary care needs.   I spent  20  minutes in the care of the patient today including review of labs from Thyroid  Function, CMP, and other relevant labs ; imaging/biopsy records (current and previous including abstractions from other facilities); face-to-face time discussing  his lab results and symptoms, medications doses, his options of short and long term treatment based on the latest standards of care / guidelines;   and documenting the encounter.  Bradley Acevedo  participated in the discussions, expressed understanding, and voiced agreement with the above plans.  All questions were answered to his satisfaction. he is encouraged to contact clinic should he have any questions or concerns prior to his return visit.   Follow up plan: Return in about 6 months (around 08/09/2024) for Fasting Labs  in AM B4 8.   Ranny Earl, MD [facility] [address] [place], [place] [address] Phone: [phone]  Fax: [phone]     02/09/2024, 11:09 AM  This note was partially dictated with voice recognition software. Similar sounding words can be transcribed inadequately or may not  be corrected upon review.

## 2024-02-16 ENCOUNTER — Other Ambulatory Visit: Payer: Self-pay

## 2024-02-16 MED ORDER — GLIPIZIDE 5 MG PO TABS: 5.0000 mg | ORAL_TABLET | Freq: Two times a day (BID) | ORAL | 3 refills | Status: AC

## 2024-03-09 ENCOUNTER — Other Ambulatory Visit: Payer: Self-pay | Admitting: "Endocrinology

## 2024-03-23 ENCOUNTER — Encounter: Payer: Self-pay | Admitting: Cardiology

## 2024-03-23 ENCOUNTER — Ambulatory Visit: Attending: Cardiology | Admitting: Cardiology

## 2024-03-23 VITALS — BP 124/64 | HR 74 | Ht 70.0 in | Wt 232.4 lb

## 2024-03-23 DIAGNOSIS — E782 Mixed hyperlipidemia: Secondary | ICD-10-CM | POA: Insufficient documentation

## 2024-03-23 DIAGNOSIS — I10 Essential (primary) hypertension: Secondary | ICD-10-CM | POA: Diagnosis not present

## 2024-03-23 DIAGNOSIS — I25119 Atherosclerotic heart disease of native coronary artery with unspecified angina pectoris: Secondary | ICD-10-CM | POA: Insufficient documentation

## 2024-03-23 MED ORDER — NITROGLYCERIN 0.4 MG SL SUBL: 0.4000 mg | SUBLINGUAL_TABLET | SUBLINGUAL | 3 refills | Status: AC | PRN

## 2024-03-23 NOTE — Patient Instructions (Signed)
 Medication Instructions:   Your physician recommends that you continue on your current medications as directed. Please refer to the Current Medication list given to you today.   Labwork: None today  Testing/Procedures: None today  Follow-Up: 6 months Dr.McDowell  Any Other Special Instructions Will Be Listed Below (If Applicable).  If you need a refill on your cardiac medications before your next appointment, please call your pharmacy.

## 2024-03-23 NOTE — Progress Notes (Signed)
"  ° ° °  Cardiology Office Note  Date: 03/23/2024   ID: Bradley Acevedo, DOB [birth year], MRN [id number]  History of Present Illness: Bradley Acevedo is a 79 y.o. male last seen in July 2025.  He is here for a routine visit.  Reports using sublingual nitroglycerin  2 or 3 times in the last 6 months.  More recently he also had some chest pressure, was under stress with his daughter hospitalized in the ICU.  She has recuperated and is back at home, he is feeling better and has not had to use any nitroglycerin .  No other change in stamina.  We went over his medications.  He reports compliance with current cardiac regimen, no concerns.  He does need a refill for fresh bottle of nitroglycerin .  I reviewed his lab work which is outlined below.  Physical Exam: VS:  BP 124/64 (BP Location: Right Arm, Cuff Size: Normal)   Pulse 74   Ht 5' 10 (1.778 m)   Wt 232 lb 6.4 oz (105.4 kg)   SpO2 99%   BMI 33.35 kg/m , BMI Body mass index is 33.35 kg/m.  Wt Readings from Last 3 Encounters:  03/23/24 232 lb 6.4 oz (105.4 kg)  02/09/24 231 lb 9.6 oz (105.1 kg)  10/06/23 226 lb 9.6 oz (102.8 kg)    General: Patient appears comfortable at rest. HEENT: Conjunctiva and lids normal. Neck: Supple, no elevated JVP or carotid bruits. Lungs: Clear to auscultation, nonlabored breathing at rest. Cardiac: Regular rate and rhythm, no S3 or significant systolic murmur. Abdomen: Bowel sounds present, no bruits.  ECG:  An ECG dated 09/15/2023 was personally reviewed today and demonstrated:  Sinus rhythm.  Labwork: 10/01/2023: BUN 19; Creatinine, Ser 1.05; Hemoglobin 13.9; Platelets 179; Potassium 5.1; Sodium 138 01/27/2024: TSH 2.120     Component Value Date/Time   CHOL 78 (L) 10/01/2023 0933   TRIG 96 10/01/2023 0933   HDL 35 (L) 10/01/2023 0933   CHOLHDL 2.2 10/01/2023 0933   CHOLHDL 3.4 06/28/2020 1035   VLDL 20 06/28/2020 1035   LDLCALC 24 10/01/2023 0933   Other Studies Reviewed Today:  No interval cardiac  testing for review today.  Assessment and Plan:  1.  CAD with moderate mid LAD stenosis by cardiac catheterization in May 2022 managed medically in light of borderline FFR.  Follow-up Lexiscan  Myoview  in May 2024 was low risk showing a small apical ischemic territory with LVEF 58%.  He does have intermittent angina, although no clear progressive pattern.  Plan to continue observation at this time, refill provided for fresh bottle of nitroglycerin .  Continue aspirin  81 mg daily, Imdur  60 mg twice daily, and Crestor  40 mg daily.   2.  Primary hypertension.  Blood pressure is adequately controlled today.  Continue Toprol -XL 50 mg daily, Norvasc  2.5 mg daily and lisinopril  10 mg daily   3.  Mixed hyperlipidemia.  LDL 24 in July 2025.  Continue Crestor  40 mg daily.  Disposition:  Follow up 6 months.  Signed, Jayson JUDITHANN Sierras, M.D., F.A.C.C. [facility] "

## 2024-04-27 ENCOUNTER — Ambulatory Visit

## 2024-08-09 ENCOUNTER — Ambulatory Visit: Admitting: "Endocrinology

## 2024-08-15 ENCOUNTER — Ambulatory Visit
